# Patient Record
Sex: Female | Born: 1965 | Race: White | Hispanic: No | Marital: Married | Smoking: Never smoker
Health system: Southern US, Community
[De-identification: ages and names within clinical notes are randomized; demographics above are authoritative.]

## PROBLEM LIST (undated history)

## (undated) DIAGNOSIS — M069 Rheumatoid arthritis, unspecified: Secondary | ICD-10-CM

## (undated) DIAGNOSIS — E559 Vitamin D deficiency, unspecified: Secondary | ICD-10-CM

## (undated) DIAGNOSIS — R7989 Other specified abnormal findings of blood chemistry: Secondary | ICD-10-CM

## (undated) DIAGNOSIS — M5030 Other cervical disc degeneration, unspecified cervical region: Secondary | ICD-10-CM

## (undated) DIAGNOSIS — N809 Endometriosis, unspecified: Secondary | ICD-10-CM

## (undated) DIAGNOSIS — M47812 Spondylosis without myelopathy or radiculopathy, cervical region: Secondary | ICD-10-CM

## (undated) DIAGNOSIS — M899 Disorder of bone, unspecified: Secondary | ICD-10-CM

## (undated) HISTORY — PX: NASAL SINUS SURGERY: SHX719

## (undated) HISTORY — DX: Vitamin D deficiency, unspecified: E55.9

## (undated) HISTORY — PX: PARTIAL HYSTERECTOMY: SHX80

## (undated) HISTORY — DX: Spondylosis without myelopathy or radiculopathy, cervical region: M47.812

## (undated) HISTORY — DX: Disorder of bone, unspecified: M89.9

## (undated) HISTORY — PX: HERNIA REPAIR: SHX51

## (undated) HISTORY — DX: Endometriosis, unspecified: N80.9

## (undated) HISTORY — PX: ABDOMINAL HYSTERECTOMY: SHX81

## (undated) HISTORY — DX: Other specified abnormal findings of blood chemistry: R79.89

## (undated) HISTORY — DX: Other cervical disc degeneration, unspecified cervical region: M50.30

## (undated) HISTORY — DX: Rheumatoid arthritis, unspecified: M06.9

## (undated) HISTORY — PX: OOPHORECTOMY: SHX86

---

## 1997-04-24 ENCOUNTER — Inpatient Hospital Stay (HOSPITAL_COMMUNITY): Admission: AD | Admit: 1997-04-24 | Discharge: 1997-04-27 | Payer: Self-pay | Admitting: Obstetrics and Gynecology

## 1997-05-16 ENCOUNTER — Other Ambulatory Visit: Admission: RE | Admit: 1997-05-16 | Discharge: 1997-05-16 | Payer: Self-pay | Admitting: Obstetrics and Gynecology

## 2004-12-09 ENCOUNTER — Ambulatory Visit: Payer: Self-pay | Admitting: Surgery

## 2005-02-15 HISTORY — PX: CHOLECYSTECTOMY: SHX55

## 2007-03-16 ENCOUNTER — Ambulatory Visit: Payer: Self-pay | Admitting: Obstetrics and Gynecology

## 2007-05-18 ENCOUNTER — Ambulatory Visit: Payer: Self-pay | Admitting: Internal Medicine

## 2007-08-09 ENCOUNTER — Ambulatory Visit: Payer: Self-pay | Admitting: Gastroenterology

## 2008-11-14 ENCOUNTER — Ambulatory Visit: Payer: Self-pay | Admitting: General Practice

## 2008-11-16 ENCOUNTER — Emergency Department: Payer: Self-pay | Admitting: Emergency Medicine

## 2009-06-23 ENCOUNTER — Ambulatory Visit: Payer: Self-pay | Admitting: General Practice

## 2009-07-01 ENCOUNTER — Ambulatory Visit: Payer: Self-pay | Admitting: Unknown Physician Specialty

## 2010-09-07 ENCOUNTER — Ambulatory Visit: Payer: Self-pay | Admitting: Internal Medicine

## 2010-12-20 ENCOUNTER — Ambulatory Visit: Payer: Self-pay | Admitting: Internal Medicine

## 2011-04-16 ENCOUNTER — Ambulatory Visit: Payer: Self-pay | Admitting: Unknown Physician Specialty

## 2011-04-16 LAB — HM COLONOSCOPY

## 2011-06-18 ENCOUNTER — Ambulatory Visit: Payer: Self-pay | Admitting: General Practice

## 2011-09-22 ENCOUNTER — Ambulatory Visit: Payer: Self-pay | Admitting: Internal Medicine

## 2011-09-24 ENCOUNTER — Ambulatory Visit: Payer: Self-pay | Admitting: Internal Medicine

## 2012-01-12 ENCOUNTER — Ambulatory Visit: Payer: Self-pay | Admitting: Obstetrics and Gynecology

## 2012-01-12 LAB — BASIC METABOLIC PANEL
Anion Gap: 8 (ref 7–16)
Calcium, Total: 8.8 mg/dL (ref 8.5–10.1)
Chloride: 105 mmol/L (ref 98–107)
Co2: 27 mmol/L (ref 21–32)
EGFR (African American): >60
Glucose: 95 mg/dL (ref 65–99)
Osmolality: 278 (ref 275–301)
Sodium: 140 mmol/L (ref 136–145)

## 2012-01-12 LAB — CBC
HCT: 38.8 % (ref 35.0–47.0)
Platelet: 272 10*3/uL (ref 150–440)
WBC: 9.5 10*3/uL (ref 3.6–11.0)

## 2012-01-17 ENCOUNTER — Ambulatory Visit: Payer: Self-pay | Admitting: Obstetrics and Gynecology

## 2012-01-18 LAB — CREATININE, SERUM
EGFR (African American): >60
EGFR (Non-African Amer.): >60

## 2012-01-18 LAB — HEMOGLOBIN: HGB: 11.4 g/dL — ABNORMAL LOW (ref 12.0–16.0)

## 2012-01-19 LAB — PATHOLOGY REPORT

## 2014-06-04 NOTE — Op Note (Signed)
PATIENT NAME:  Kimberly Cantrell, Kimberly Cantrell MR#:  [id number] DATE OF BIRTH:  1965-08-04  DATE OF PROCEDURE:  01/17/2012  PREOPERATIVE DIAGNOSES:  1. Chronic pelvic pain.  2. Menorrhagia.  3. Leiomyoma uteri.   POSTOPERATIVE DIAGNOSES:  1. Chronic pelvic pain.  2. Menorrhagia.  3. Leiomyoma uteri.  4. Right ovarian cyst.  5. Endometriosis.   OPERATIVE PROCEDURE:  1. LAVH, LSO.  2. Right salpingectomy.  3. Right ovarian cyst aspiration.  4. Excisional biopsies of endometriosis.   SURGEON: Alanda Slim. Haly Feher, MD   FIRST ASSISTANT: None.   ANESTHESIA: General endotracheal.   INDICATIONS: The patient is a 49 year old married white female, para 3-0-0-3, who presents for definitive surgical management of chronic pelvic pain and menorrhagia. Preoperative ultrasound demonstrated a small uterine fibroid in the fundus as well as a 6 cm simple ovarian cyst.   FINDINGS AT SURGERY: Findings at surgery revealed a uterus that was top normal size with a small fundal fibroid. There was a simple right ovarian cyst present. This cyst was aspirated and noted to have serosanguineous fluid. There was evidence of endometriosis implants on the bladder flap, right anterior abdominal wall near the round ligament insertion site, and on the left infundibulopelvic ligament and left pelvic sidewall in the region of the ovarian fossa. The left ovary appeared normal.   DESCRIPTION OF PROCEDURE: The patient was brought to the operating room where she was placed in the supine position. General endotracheal anesthesia was induced without difficulty. She was placed in the dorsal lithotomy position using the bumblebee stirrups. A ChloraPrep and Betadine abdominal, perineal, and intravaginal prep and drape was performed in the standard fashion. Foley catheter was placed and was draining clear yellow urine. A Hulka tenaculum was placed onto the cervix to facilitate uterine manipulation. Subumbilical vertical incision 5 mm in length  was made. The Optiview laparoscopic trocar system was used to place a 5 mm camera port in the subumbilical incision through direct visualization. No bowel or vascular injury was encountered. Pneumoperitoneum was created with CO2 gas. Two other 5 mm ports were placed in the right and left lower quadrants respectively under direct visualization. The procedure was then performed in the following manner.   The right tube was grasped and the Ace Harmonic scalpel instrument was used to facilitate clamping, coagulating, and incising the mesosalpinx. The fallopian tube was removed through this procedure down to the level of the uterine cornua. Good hemostasis was obtained. The right ovarian cyst was then incised with the Harmonic scalpel and it thus released approximately 25 mL of serosanguineous yellow fluid.   Next, the hysterectomy was performed in standard fashion. The right round ligament was clamped, coagulated, and cut using the Harmonic scalpel. The cardinal broad ligament complexes were then taken down in the standard technique using the Harmonic scalpel to expose the uterine vessels. The anterior peritoneum was incised and the bladder flap was created through sharp and blunt dissection. Once the ligamentous attachments on the right were taken down, the procedure was carried out on the left side although this also incorporated the infundibulopelvic ligament with removal of the left tube and ovary as well. Care was taken to avoid the ureters. This process again was carried down through the cardinal broad ligament complexes to the level of the uterosacral ligaments. Completion of the bladder flap was performed with the Harmonic scalpel. The uterine vessels were then cauterized with Kleppinger bipolar forceps. This completed the laparoscopic portion of the procedure.   The patient was then placed in  the dorsal lithotomy position and the vaginal aspect of the procedure was completed. A weighted speculum was  placed into the vagina. A double-tooth tenaculum was placed onto the cervix. The previously placed Hulka tenaculum was removed. The posterior colpotomy was made with Mayo scissors. Uterosacral ligaments were clamped, cut, and stick tied using 0 Vicryl suture. Cervix was circumscribed with the Bovie cautery. The bladder was dissected off the lower uterine segment through sharp and blunt dissection. Eventually the anterior cul-de-sac was entered. Once the final pedicles of the cardinal ligament complex were then clamped, cut, and stick tied, the specimen was removed from the operative field. The posterior cuff of the vagina was run using 0 Vicryl suture in a simple baseball stitch-type technique. This was followed by closure of the vagina with 2-0 chromic sutures in a simple interrupted manner. Good vaginal vault support was noted.   Finally, repeat laparoscopy was performed with pneumoperitoneum being recreated in the abdomen. Evaluation with the laparoscope revealed all pedicles to be hemostatic. The peritoneal excisional biopsies of the endometriosis were then accomplished and these were sent to pathology. Procedure was then terminated with all instrumentation being removed from the abdominopelvic cavity. Pneumoperitoneum was released. The incisions were closed with Dermabond glue. The patient was then awakened, extubated, and taken to the recovery room in satisfactory condition. Estimated blood loss was 250 mL. IV fluids were 1600 mL. Urine output was not quantified at the time of this dictation.   All instruments, needles, and sponge counts were verified as correct. The patient did receive Ancef antibiotic prophylaxis.   ____________________________ Alanda Slim Cyndia Degraff, MD mad:drc D: 01/18/2012 23:25:41 ET T: 01/19/2012 08:56:41 ET JOB#: [id number]  cc: Hassell Done A. Danella Philson, MD, <Dictator> [facility] MD ELECTRONICALLY SIGNED 01/26/2012 11:57

## 2014-11-15 ENCOUNTER — Other Ambulatory Visit: Payer: Self-pay | Admitting: Physician Assistant

## 2014-11-15 NOTE — Telephone Encounter (Signed)
Rx written on 05/11/14  Last filled on 09/19/14

## 2014-11-15 NOTE — Telephone Encounter (Signed)
Patient was contacted and informed that she needs to be seen; Denied refill pt acknowledge understanding.

## 2014-12-05 ENCOUNTER — Telehealth: Payer: Self-pay | Admitting: Physician Assistant

## 2014-12-05 NOTE — Telephone Encounter (Signed)
Tell Kimberly Cantrell she needs to be seen in the clinic for refills of a controlled substance.

## 2014-12-09 ENCOUNTER — Encounter: Payer: Self-pay | Admitting: Physician Assistant

## 2014-12-09 ENCOUNTER — Ambulatory Visit: Payer: Self-pay | Admitting: Physician Assistant

## 2014-12-09 ENCOUNTER — Other Ambulatory Visit: Payer: Self-pay | Admitting: Physician Assistant

## 2014-12-09 VITALS — BP 100/72 | Temp 98.3°F | Wt 150.0 lb

## 2014-12-09 DIAGNOSIS — G4700 Insomnia, unspecified: Secondary | ICD-10-CM

## 2014-12-09 MED ORDER — ZOLPIDEM TARTRATE 10 MG PO TABS: 10.0000 mg | ORAL_TABLET | Freq: Every evening | ORAL | Status: DC | PRN

## 2014-12-09 NOTE — Progress Notes (Signed)
S: c/o insomnia, has been using [place] about 3 nights a week, out of medication, also has dentist appointment on Wed for tooth pain, ?if she should be on antibiotic or can it wait until tues, ?if tooth is fx or has cavity, dental floss is catching on tooth  O: vitals wnl, nad, upper back left molar with ?fx on tooth, gums are wnl, neck supple no lymph, lungs c t a, cv rrr  A: insomnia, tooth pain  P: ambien 10mg  1 po qhs, #30 3 refills, return if additional refills needed, f/u with dentist, no antibiotic needed

## 2014-12-10 NOTE — Telephone Encounter (Signed)
Completed patient was seen in office for New Rx(Ambien) on 12/09/2014

## 2015-01-17 ENCOUNTER — Other Ambulatory Visit: Payer: Self-pay | Admitting: Orthopedic Surgery

## 2015-01-17 ENCOUNTER — Ambulatory Visit: Payer: Self-pay | Admitting: Physician Assistant

## 2015-01-17 DIAGNOSIS — M5412 Radiculopathy, cervical region: Secondary | ICD-10-CM

## 2015-01-24 ENCOUNTER — Other Ambulatory Visit: Payer: Self-pay | Admitting: Orthopedic Surgery

## 2015-01-24 DIAGNOSIS — M545 Low back pain: Secondary | ICD-10-CM

## 2015-02-04 ENCOUNTER — Telehealth: Payer: Self-pay | Admitting: Obstetrics and Gynecology

## 2015-02-04 ENCOUNTER — Other Ambulatory Visit: Payer: Self-pay

## 2015-02-04 DIAGNOSIS — M2550 Pain in unspecified joint: Secondary | ICD-10-CM

## 2015-02-04 DIAGNOSIS — Z01419 Encounter for gynecological examination (general) (routine) without abnormal findings: Secondary | ICD-10-CM

## 2015-02-04 DIAGNOSIS — Z1239 Encounter for other screening for malignant neoplasm of breast: Secondary | ICD-10-CM

## 2015-02-04 NOTE — Telephone Encounter (Signed)
Patient called requesting a referral to [facility]. She is having joint pain. Thanks

## 2015-02-04 NOTE — Telephone Encounter (Signed)
Referral and mammo ordered. Pt aware per vm.

## 2015-02-04 NOTE — Telephone Encounter (Signed)
Patient also needs an order put in epic for a screening mammo. Thanks

## 2015-02-04 NOTE — Telephone Encounter (Signed)
Done see previous message

## 2015-02-05 LAB — LIPID PANEL
CHOLESTEROL TOTAL: 175 mg/dL (ref 100–199)
Chol/HDL Ratio: 4.1 ratio units (ref 0.0–4.4)
HDL: 43 mg/dL (ref >39)
LDL Calculated: 86 mg/dL (ref 0–99)
TRIGLYCERIDES: 230 mg/dL — AB (ref 0–149)
VLDL Cholesterol Cal: 46 mg/dL — ABNORMAL HIGH (ref 5–40)

## 2015-02-05 LAB — HEMOGLOBIN A1C
Est. average glucose Bld gHb Est-mCnc: 91 mg/dL
HEMOGLOBIN A1C: 4.8 % (ref 4.8–5.6)

## 2015-02-05 LAB — TSH: TSH: 4.15 u[IU]/mL (ref 0.450–4.500)

## 2015-02-05 LAB — VITAMIN D 25 HYDROXY (VIT D DEFICIENCY, FRACTURES): Vit D, 25-Hydroxy: 13.6 ng/mL — ABNORMAL LOW (ref 30.0–100.0)

## 2015-02-05 LAB — GLUCOSE, FASTING: Glucose, Plasma: 89 mg/dL (ref 65–99)

## 2015-02-06 ENCOUNTER — Ambulatory Visit
Admission: RE | Admit: 2015-02-06 | Discharge: 2015-02-06 | Disposition: A | Discharge status: Home / Self Care | Payer: 59 | Source: Ambulatory Visit | Attending: Orthopedic Surgery | Admitting: Orthopedic Surgery

## 2015-02-06 DIAGNOSIS — M50321 Other cervical disc degeneration at C4-C5 level: Secondary | ICD-10-CM | POA: Insufficient documentation

## 2015-02-06 DIAGNOSIS — M545 Low back pain: Secondary | ICD-10-CM | POA: Insufficient documentation

## 2015-02-06 DIAGNOSIS — M5412 Radiculopathy, cervical region: Secondary | ICD-10-CM | POA: Diagnosis present

## 2015-02-06 DIAGNOSIS — M4802 Spinal stenosis, cervical region: Secondary | ICD-10-CM | POA: Insufficient documentation

## 2015-02-07 ENCOUNTER — Encounter: Payer: Self-pay | Admitting: Family Medicine

## 2015-02-07 ENCOUNTER — Other Ambulatory Visit: Payer: Self-pay | Admitting: Family Medicine

## 2015-02-07 ENCOUNTER — Inpatient Hospital Stay: Payer: 59 | Attending: Family Medicine | Admitting: *Deleted

## 2015-02-07 DIAGNOSIS — M899 Disorder of bone, unspecified: Secondary | ICD-10-CM | POA: Insufficient documentation

## 2015-02-07 DIAGNOSIS — M4802 Spinal stenosis, cervical region: Secondary | ICD-10-CM | POA: Insufficient documentation

## 2015-02-07 DIAGNOSIS — E559 Vitamin D deficiency, unspecified: Secondary | ICD-10-CM | POA: Diagnosis not present

## 2015-02-07 DIAGNOSIS — M5031 Other cervical disc degeneration,  high cervical region: Secondary | ICD-10-CM | POA: Insufficient documentation

## 2015-02-07 DIAGNOSIS — R200 Anesthesia of skin: Secondary | ICD-10-CM | POA: Diagnosis not present

## 2015-02-07 DIAGNOSIS — M549 Dorsalgia, unspecified: Secondary | ICD-10-CM | POA: Insufficient documentation

## 2015-02-07 DIAGNOSIS — Z9181 History of falling: Secondary | ICD-10-CM | POA: Insufficient documentation

## 2015-02-07 DIAGNOSIS — R937 Abnormal findings on diagnostic imaging of other parts of musculoskeletal system: Secondary | ICD-10-CM | POA: Diagnosis not present

## 2015-02-07 HISTORY — DX: Disorder of bone, unspecified: M89.9

## 2015-02-07 LAB — CBC WITH DIFFERENTIAL/PLATELET
BASOS PCT: 0 %
Basophils Absolute: 0 10*3/uL (ref 0–0.1)
EOS ABS: 0.1 10*3/uL (ref 0–0.7)
EOS PCT: 0 %
HCT: 39.2 % (ref 35.0–47.0)
HEMOGLOBIN: 13.3 g/dL (ref 12.0–16.0)
Lymphocytes Relative: 15 %
Lymphs Abs: 1.9 10*3/uL (ref 1.0–3.6)
MCH: 30.8 pg (ref 26.0–34.0)
MCHC: 34 g/dL (ref 32.0–36.0)
MCV: 90.6 fL (ref 80.0–100.0)
MONOS PCT: 3 %
Monocytes Absolute: 0.4 10*3/uL (ref 0.2–0.9)
NEUTROS PCT: 82 %
Neutro Abs: 9.9 10*3/uL — ABNORMAL HIGH (ref 1.4–6.5)
PLATELETS: 269 10*3/uL (ref 150–440)
RBC: 4.33 MIL/uL (ref 3.80–5.20)
RDW: 12.7 % (ref 11.5–14.5)
WBC: 12.2 10*3/uL — AB (ref 3.6–11.0)

## 2015-02-07 LAB — COMPREHENSIVE METABOLIC PANEL
ALBUMIN: 4.2 g/dL (ref 3.5–5.0)
ALK PHOS: 100 U/L (ref 38–126)
ALT: 18 U/L (ref 14–54)
ANION GAP: 9 (ref 5–15)
AST: 19 U/L (ref 15–41)
BUN: 8 mg/dL (ref 6–20)
CALCIUM: 9.2 mg/dL (ref 8.9–10.3)
CHLORIDE: 105 mmol/L (ref 101–111)
CO2: 26 mmol/L (ref 22–32)
Creatinine, Ser: 0.81 mg/dL (ref 0.44–1.00)
GFR calc Af Amer: >60 mL/min (ref >60)
GFR calc non Af Amer: >60 mL/min (ref >60)
GLUCOSE: 96 mg/dL (ref 65–99)
POTASSIUM: 3.7 mmol/L (ref 3.5–5.1)
SODIUM: 140 mmol/L (ref 135–145)
Total Bilirubin: 0.5 mg/dL (ref 0.3–1.2)
Total Protein: 7 g/dL (ref 6.5–8.1)

## 2015-02-11 ENCOUNTER — Inpatient Hospital Stay (HOSPITAL_BASED_OUTPATIENT_CLINIC_OR_DEPARTMENT_OTHER): Payer: 59 | Admitting: Family Medicine

## 2015-02-11 ENCOUNTER — Encounter: Payer: Self-pay | Admitting: Family Medicine

## 2015-02-11 ENCOUNTER — Inpatient Hospital Stay: Payer: 59

## 2015-02-11 ENCOUNTER — Ambulatory Visit
Admission: RE | Admit: 2015-02-11 | Discharge: 2015-02-11 | Disposition: A | Discharge status: Home / Self Care | Payer: 59 | Source: Ambulatory Visit | Attending: Obstetrics and Gynecology | Admitting: Obstetrics and Gynecology

## 2015-02-11 ENCOUNTER — Ambulatory Visit: Payer: Self-pay | Admitting: Family Medicine

## 2015-02-11 ENCOUNTER — Other Ambulatory Visit: Payer: Self-pay

## 2015-02-11 ENCOUNTER — Other Ambulatory Visit: Payer: Self-pay | Admitting: Family Medicine

## 2015-02-11 VITALS — BP 109/51 | HR 80 | Temp 98.8°F | Resp 18 | Ht 68.0 in | Wt 149.5 lb

## 2015-02-11 DIAGNOSIS — M5031 Other cervical disc degeneration,  high cervical region: Secondary | ICD-10-CM

## 2015-02-11 DIAGNOSIS — Z1231 Encounter for screening mammogram for malignant neoplasm of breast: Secondary | ICD-10-CM | POA: Insufficient documentation

## 2015-02-11 DIAGNOSIS — M899 Disorder of bone, unspecified: Secondary | ICD-10-CM

## 2015-02-11 DIAGNOSIS — M4802 Spinal stenosis, cervical region: Secondary | ICD-10-CM

## 2015-02-11 DIAGNOSIS — E559 Vitamin D deficiency, unspecified: Secondary | ICD-10-CM

## 2015-02-11 DIAGNOSIS — M549 Dorsalgia, unspecified: Secondary | ICD-10-CM

## 2015-02-11 DIAGNOSIS — R200 Anesthesia of skin: Secondary | ICD-10-CM

## 2015-02-11 DIAGNOSIS — Z9181 History of falling: Secondary | ICD-10-CM | POA: Diagnosis not present

## 2015-02-11 DIAGNOSIS — R937 Abnormal findings on diagnostic imaging of other parts of musculoskeletal system: Secondary | ICD-10-CM

## 2015-02-11 DIAGNOSIS — Z1239 Encounter for other screening for malignant neoplasm of breast: Secondary | ICD-10-CM

## 2015-02-11 LAB — MISC LABCORP TEST (SEND OUT): LABCORP TEST CODE: 3715

## 2015-02-11 LAB — PROTEIN ELECTROPHORESIS, SERUM
A/G Ratio: 1.2 (ref 0.7–1.7)
ALBUMIN ELP: 3.5 g/dL (ref 2.9–4.4)
Alpha-1-Globulin: 0.3 g/dL (ref 0.0–0.4)
Alpha-2-Globulin: 0.7 g/dL (ref 0.4–1.0)
Beta Globulin: 1 g/dL (ref 0.7–1.3)
GLOBULIN, TOTAL: 2.9 g/dL (ref 2.2–3.9)
Gamma Globulin: 0.9 g/dL (ref 0.4–1.8)
Total Protein ELP: 6.4 g/dL (ref 6.0–8.5)

## 2015-02-11 LAB — SEDIMENTATION RATE: Sed Rate: 20 mm/h (ref 0–20)

## 2015-02-11 NOTE — Progress Notes (Signed)
[place]  [phone]  [phone]) [phone]     Kimberly Cantrell DOB: 08-11-1965  MR#: [id number]  HLK#:[id number]  Patient Care Team: Brayton Mars, MD as PCP - General (Obstetrics and Gynecology)  CHIEF COMPLAINT:  Chief Complaint  Patient presents with  . Results    abn mri spine    INTERVAL HISTORY:  49 year old with significant back and neck pain that has recently had an abnormal MRI of the thoracic spine. Noted with history of cervical neck pain radiating to both shoulders and arms with bilateral upper extremity numbness. Symptoms worse at night, She is also status post fall 4 years ago. She also has a known history of benign hemangiomas within the T11, T12, L1, and L2. Patient will be established with Dr. Oliva Bustard and is seen in his absence. Dr. Oliva Bustard has reviewed medical records and lab values and we have discussed her plan of care via telephone.   Patient was evaluated by Dr. Marlou Sa and cervical and lumbar spine were ordered on 02/06/2015. Cervical MRI noted to have progressed multifactorial C6 and C7 neural foraminal stenosis since 2013, moderate to severe; Severe left C8 foraminal stenosis has not significantly changed; Stable borderline to mild spinal stenosis at C5-C6; mild regression of disc degeneration at C4-C5.  Lumbar MRI showed degenerative disc disease at L3-4. Retrolisthesis of 2 mm. Bulging of the disc, focally more prominent in the left posterior lateral direction. Mild narrowing of the left lateral recess but no visible neural compression. The findings at this level could be associated with back pain. No other significant degenerative finding. MRI significant for development of a 16 mm T2 bright lesion within the T12 vertebral body.   REVIEW OF SYSTEMS:   Review of Systems  Constitutional: Negative for fever, chills, weight loss, malaise/fatigue and diaphoresis.  HENT: Negative for congestion and sore throat.   Eyes: Negative.     Respiratory: Negative for cough, hemoptysis, sputum production, shortness of breath, wheezing and stridor.   Cardiovascular: Negative for chest pain, palpitations, orthopnea, claudication, leg swelling and PND.  Gastrointestinal: Negative for heartburn, nausea, vomiting, abdominal pain, diarrhea, constipation, blood in stool and melena.  Genitourinary: Negative.   Musculoskeletal: Positive for back pain and neck pain.  Skin: Negative.   Neurological: Negative for dizziness, tingling, focal weakness, seizures and weakness.  Endo/Heme/Allergies: Does not bruise/bleed easily.  Psychiatric/Behavioral: Negative for depression. The patient is not nervous/anxious and does not have insomnia.     As per HPI. Otherwise, a complete review of systems is negatve.   PAST MEDICAL HISTORY: Past Medical History  Diagnosis Date  . Bone lesion 02/07/2015  . Endometriosis   . Vitamin D deficiency   . DJD (degenerative joint disease) of cervical spine   . DDD (degenerative disc disease), cervical     PAST SURGICAL HISTORY: Past Surgical History  Procedure Laterality Date  . Partial hysterectomy      1 ovary remaining  . Hernia repair      age 86  . Nasal sinus surgery      age 52  . Cholecystectomy  2007    FAMILY HISTORY Family History  Problem Relation Age of Onset  . Heart attack Father     GYNECOLOGIC HISTORY:  No LMP recorded. Patient has had a hysterectomy.     ADVANCED DIRECTIVES:    HEALTH MAINTENANCE: Social History  Substance Use Topics  . Smoking status: Never Smoker   . Smokeless tobacco: Never Used  . Alcohol Use:  No     Allergies  Allergen Reactions  . Metronidazole Other (See Comments)  . Clindamycin Hcl Rash  . Ketoconazole Rash    Current Outpatient Prescriptions  Medication Sig Dispense Refill  . Naproxen Sodium (ALEVE) 220 MG CAPS Take 1-2 capsule by mouth three times a day as needed for pain.    Marland Kitchen zolpidem (AMBIEN) 10 MG tablet Take 1 tablet (10 mg  total) by mouth at bedtime as needed for sleep. 30 tablet 3   No current facility-administered medications for this visit.    OBJECTIVE: BP 109/51 mmHg  Pulse 80  Temp(Src) 98.8 F (37.1 C)  Resp 18  Ht '5\' 8"'$  (1.727 m)  Wt 149 lb 7.6 oz (67.8 kg)  BMI 22.73 kg/m2   Body mass index is 22.73 kg/(m^2).    ECOG FS:1 - Symptomatic but completely ambulatory  General: Well-developed, well-nourished, no acute distress. Eyes: Pink conjunctiva, anicteric sclera. HEENT: Normocephalic, moist mucous membranes, clear oropharnyx. Lungs: Clear to auscultation bilaterally. Heart: Regular rate and rhythm. No rubs, murmurs, or gallops. Abdomen: Soft, nontender, nondistended. No organomegaly noted, normoactive bowel sounds. Breast: Breast palpated in a circular manner in the sitting and supine positions.  No masses or fullness palpated.  Axilla palpated in both positions with no masses or fullness palpated.  Musculoskeletal: No edema, cyanosis, or clubbing. Neuro: Alert, answering all questions appropriately. Cranial nerves grossly intact. Skin: No rashes or petechiae noted. Psych: Normal affect. Lymphatics: No cervical, clavicular, or axillary LAD.   LAB RESULTS:  No visits with results within 3 Day(s) from this visit. Latest known visit with results is:  Clinical Support on 02/07/2015  Component Date Value Ref Range Status  . WBC 02/07/2015 12.2* 3.6 - 11.0 K/uL Final  . RBC 02/07/2015 4.33  3.80 - 5.20 MIL/uL Final  . Hemoglobin 02/07/2015 13.3  12.0 - 16.0 g/dL Final  . HCT 02/07/2015 39.2  35.0 - 47.0 % Final  . MCV 02/07/2015 90.6  80.0 - 100.0 fL Final  . MCH 02/07/2015 30.8  26.0 - 34.0 pg Final  . MCHC 02/07/2015 34.0  32.0 - 36.0 g/dL Final  . RDW 02/07/2015 12.7  11.5 - 14.5 % Final  . Platelets 02/07/2015 269  150 - 440 K/uL Final  . Neutrophils Relative % 02/07/2015 82   Final  . Neutro Abs 02/07/2015 9.9* 1.4 - 6.5 K/uL Final  . Lymphocytes Relative 02/07/2015 15   Final  .  Lymphs Abs 02/07/2015 1.9  1.0 - 3.6 K/uL Final  . Monocytes Relative 02/07/2015 3   Final  . Monocytes Absolute 02/07/2015 0.4  0.2 - 0.9 K/uL Final  . Eosinophils Relative 02/07/2015 0   Final  . Eosinophils Absolute 02/07/2015 0.1  0 - 0.7 K/uL Final  . Basophils Relative 02/07/2015 0   Final  . Basophils Absolute 02/07/2015 0.0  0 - 0.1 K/uL Final  . Sodium 02/07/2015 140  135 - 145 mmol/L Final  . Potassium 02/07/2015 3.7  3.5 - 5.1 mmol/L Final  . Chloride 02/07/2015 105  101 - 111 mmol/L Final  . CO2 02/07/2015 26  22 - 32 mmol/L Final  . Glucose, Bld 02/07/2015 96  65 - 99 mg/dL Final  . BUN 02/07/2015 8  6 - 20 mg/dL Final  . Creatinine, Ser 02/07/2015 0.81  0.44 - 1.00 mg/dL Final  . Calcium 02/07/2015 9.2  8.9 - 10.3 mg/dL Final  . Total Protein 02/07/2015 7.0  6.5 - 8.1 g/dL Final  . Albumin 02/07/2015 4.2  3.5 - 5.0 g/dL Final  . AST 02/07/2015 19  15 - 41 U/L Final  . ALT 02/07/2015 18  14 - 54 U/L Final  . Alkaline Phosphatase 02/07/2015 100  38 - 126 U/L Final  . Total Bilirubin 02/07/2015 0.5  0.3 - 1.2 mg/dL Final  . GFR calc non Af Amer 02/07/2015 >60  >60 mL/min Final  . GFR calc Af Amer 02/07/2015 >60  >60 mL/min Final   Comment: (NOTE) The eGFR has been calculated using the CKD EPI equation. This calculation has not been validated in all clinical situations. eGFR's persistently <60 mL/min signify possible Chronic Kidney Disease.   . Anion gap 02/07/2015 9  5 - 15 Final    STUDIES: No results found.  ASSESSMENT:  Bone lesion involving the T12 vertebrae.   PLAN:   1. Suspicious bone lesion of T12 vertebrae. MRI has been reviewed by myself as well as Dr. Oliva Bustard. Lesion in question is involving the T12 vertebrae. She has benign hemangiomas involving the T11, T12, L1, and L2 vertebrae. The lesion in question is new from 2013 on the T12 vertebral body that measures 107m. Differentials include metastatic disease, myeloma, or more benign findings including  development of hemangioma, worsening disc disease.   Instructed patient that she needed to have yearly screening mammogram performed asap. Also collected labs to further evaluate if there is any significant finding for Multiple Myeloma.  Will schedule CT of chest, abdomen, and pelvis with contrast for further evaluation of other major organs. Skeletal Survey to be performed for further evaluation as well.  Will also add patient for Radiologic review during case conference the week of February 20, 2015.  Will continue with follow up once labs have been received as well as radiology images.   Patient expressed understanding and was in agreement with this plan. She also understands that She can call clinic at any time with any questions, concerns, or complaints.   Dr. COliva Bustardwas consulted and review of plan of care performed for this patient.   LEvlyn Kanner NP   02/11/2015 10:41 AM

## 2015-02-12 ENCOUNTER — Ambulatory Visit
Admission: RE | Admit: 2015-02-12 | Discharge: 2015-02-12 | Disposition: A | Discharge status: Home / Self Care | Payer: 59 | Source: Ambulatory Visit | Attending: Family Medicine | Admitting: Family Medicine

## 2015-02-12 ENCOUNTER — Other Ambulatory Visit: Payer: Self-pay | Admitting: Family Medicine

## 2015-02-12 DIAGNOSIS — M899 Disorder of bone, unspecified: Secondary | ICD-10-CM | POA: Diagnosis not present

## 2015-02-12 DIAGNOSIS — N83201 Unspecified ovarian cyst, right side: Secondary | ICD-10-CM | POA: Insufficient documentation

## 2015-02-12 MED ORDER — IOHEXOL 300 MG/ML  SOLN
100.0000 mL | Freq: Once | INTRAMUSCULAR | Status: AC | PRN
  Administered 2015-02-12: 100 mL via INTRAVENOUS

## 2015-02-13 LAB — KAPPA/LAMBDA LIGHT CHAINS
Kappa free light chain: 10.39 mg/L (ref 3.30–19.40)
Kappa, lambda light chain ratio: 0.91 (ref 0.26–1.65)
Lambda free light chains: 11.48 mg/L (ref 5.71–26.30)

## 2015-02-13 LAB — RHEUMATOID ARTHRITIS PROFILE
CCP Antibodies IgG/IgA: 71 units — ABNORMAL HIGH (ref 0–19)
Rhuematoid fact SerPl-aCnc: <10 IU/mL (ref 0.0–13.9)

## 2015-02-18 ENCOUNTER — Telehealth: Payer: Self-pay

## 2015-02-18 DIAGNOSIS — E782 Mixed hyperlipidemia: Secondary | ICD-10-CM

## 2015-02-18 NOTE — Telephone Encounter (Signed)
Pt aware. Lipid panel ordered for 6 months. Put on lab schedule.

## 2015-02-18 NOTE — Telephone Encounter (Signed)
-----   Message from Brayton Mars, MD sent at 02/07/2015 11:20 AM EST ----- Please notify - Abnormal Labs Take vitamin D 1000 units daily. Recommended dietary modification with low-fat diet and recheck lipid 1 panel in 6 months

## 2015-02-27 ENCOUNTER — Other Ambulatory Visit: Payer: Self-pay | Admitting: Family Medicine

## 2015-03-10 DIAGNOSIS — L570 Actinic keratosis: Secondary | ICD-10-CM | POA: Diagnosis not present

## 2015-03-10 DIAGNOSIS — D485 Neoplasm of uncertain behavior of skin: Secondary | ICD-10-CM | POA: Diagnosis not present

## 2015-03-10 DIAGNOSIS — D225 Melanocytic nevi of trunk: Secondary | ICD-10-CM | POA: Diagnosis not present

## 2015-03-27 DIAGNOSIS — R1012 Left upper quadrant pain: Secondary | ICD-10-CM | POA: Diagnosis not present

## 2015-03-27 DIAGNOSIS — R1013 Epigastric pain: Secondary | ICD-10-CM | POA: Diagnosis not present

## 2015-03-28 ENCOUNTER — Ambulatory Visit: Payer: Self-pay | Admitting: Physician Assistant

## 2015-04-01 DIAGNOSIS — R1012 Left upper quadrant pain: Secondary | ICD-10-CM | POA: Diagnosis not present

## 2015-04-01 DIAGNOSIS — M542 Cervicalgia: Secondary | ICD-10-CM | POA: Diagnosis not present

## 2015-04-01 DIAGNOSIS — G8929 Other chronic pain: Secondary | ICD-10-CM | POA: Diagnosis not present

## 2015-04-09 DIAGNOSIS — M5412 Radiculopathy, cervical region: Secondary | ICD-10-CM | POA: Diagnosis not present

## 2015-04-09 DIAGNOSIS — M5413 Radiculopathy, cervicothoracic region: Secondary | ICD-10-CM | POA: Diagnosis not present

## 2015-04-29 DIAGNOSIS — R1012 Left upper quadrant pain: Secondary | ICD-10-CM | POA: Diagnosis not present

## 2015-05-08 ENCOUNTER — Other Ambulatory Visit: Payer: Self-pay | Admitting: *Deleted

## 2015-05-08 ENCOUNTER — Inpatient Hospital Stay: Payer: 59 | Attending: Family Medicine

## 2015-05-08 DIAGNOSIS — K769 Liver disease, unspecified: Secondary | ICD-10-CM

## 2015-05-08 DIAGNOSIS — R109 Unspecified abdominal pain: Secondary | ICD-10-CM

## 2015-05-08 DIAGNOSIS — M899 Disorder of bone, unspecified: Secondary | ICD-10-CM | POA: Insufficient documentation

## 2015-05-08 DIAGNOSIS — M549 Dorsalgia, unspecified: Secondary | ICD-10-CM

## 2015-05-08 LAB — CBC WITH DIFFERENTIAL/PLATELET
BASOS ABS: 0.1 10*3/uL (ref 0–0.1)
Basophils Relative: 1 %
EOS PCT: 1 %
Eosinophils Absolute: 0.1 10*3/uL (ref 0–0.7)
HCT: 41.2 % (ref 35.0–47.0)
Hemoglobin: 14.3 g/dL (ref 12.0–16.0)
LYMPHS PCT: 23 %
Lymphs Abs: 2.6 10*3/uL (ref 1.0–3.6)
MCH: 31.4 pg (ref 26.0–34.0)
MCHC: 34.7 g/dL (ref 32.0–36.0)
MCV: 90.4 fL (ref 80.0–100.0)
MONO ABS: 0.5 10*3/uL (ref 0.2–0.9)
Monocytes Relative: 5 %
NEUTROS PCT: 70 %
Neutro Abs: 8 10*3/uL — ABNORMAL HIGH (ref 1.4–6.5)
PLATELETS: 298 10*3/uL (ref 150–440)
RBC: 4.55 MIL/uL (ref 3.80–5.20)
RDW: 12.5 % (ref 11.5–14.5)
WBC: 11.3 10*3/uL — AB (ref 3.6–11.0)

## 2015-05-08 LAB — URINALYSIS COMPLETE WITH MICROSCOPIC (ARMC ONLY)
BILIRUBIN URINE: NEGATIVE
Bacteria, UA: NONE SEEN
Glucose, UA: NEGATIVE mg/dL
KETONES UR: NEGATIVE mg/dL
Leukocytes, UA: NEGATIVE
Nitrite: NEGATIVE
Protein, ur: NEGATIVE mg/dL
Specific Gravity, Urine: 1.011 (ref 1.005–1.030)
pH: 6 (ref 5.0–8.0)

## 2015-05-09 LAB — CEA: CEA: 1.1 ng/mL (ref 0.0–4.7)

## 2015-05-09 LAB — AFP TUMOR MARKER: AFP TUMOR MARKER: 6.1 ng/mL (ref 0.0–8.3)

## 2015-05-10 LAB — URINE CULTURE

## 2015-05-20 DIAGNOSIS — K3189 Other diseases of stomach and duodenum: Secondary | ICD-10-CM | POA: Diagnosis not present

## 2015-05-20 DIAGNOSIS — K2960 Other gastritis without bleeding: Secondary | ICD-10-CM | POA: Diagnosis not present

## 2015-05-20 DIAGNOSIS — K2950 Unspecified chronic gastritis without bleeding: Secondary | ICD-10-CM | POA: Diagnosis not present

## 2015-05-20 DIAGNOSIS — R1012 Left upper quadrant pain: Secondary | ICD-10-CM | POA: Diagnosis not present

## 2015-05-20 DIAGNOSIS — K2970 Gastritis, unspecified, without bleeding: Secondary | ICD-10-CM | POA: Diagnosis not present

## 2015-05-29 ENCOUNTER — Other Ambulatory Visit: Payer: Self-pay | Admitting: Physician Assistant

## 2015-05-29 DIAGNOSIS — R1084 Generalized abdominal pain: Secondary | ICD-10-CM | POA: Diagnosis not present

## 2015-05-29 DIAGNOSIS — R1012 Left upper quadrant pain: Secondary | ICD-10-CM

## 2015-05-30 ENCOUNTER — Ambulatory Visit
Admission: RE | Admit: 2015-05-30 | Discharge: 2015-05-30 | Disposition: A | Discharge status: Home / Self Care | Payer: 59 | Source: Ambulatory Visit | Attending: Oncology | Admitting: Oncology

## 2015-05-30 ENCOUNTER — Ambulatory Visit: Payer: 59

## 2015-05-30 DIAGNOSIS — K769 Liver disease, unspecified: Secondary | ICD-10-CM

## 2015-06-04 ENCOUNTER — Ambulatory Visit: Payer: 59

## 2015-06-09 ENCOUNTER — Ambulatory Visit (HOSPITAL_COMMUNITY)
Admission: RE | Admit: 2015-06-09 | Discharge: 2015-06-09 | Disposition: A | Discharge status: Home / Self Care | Payer: 59 | Source: Ambulatory Visit | Attending: Oncology | Admitting: Oncology

## 2015-06-09 ENCOUNTER — Ambulatory Visit (HOSPITAL_COMMUNITY): Admission: RE | Admit: 2015-06-09 | Payer: 59 | Source: Ambulatory Visit

## 2015-06-09 DIAGNOSIS — M549 Dorsalgia, unspecified: Secondary | ICD-10-CM | POA: Diagnosis not present

## 2015-06-09 DIAGNOSIS — M4723 Other spondylosis with radiculopathy, cervicothoracic region: Secondary | ICD-10-CM | POA: Diagnosis not present

## 2015-06-09 DIAGNOSIS — M4726 Other spondylosis with radiculopathy, lumbar region: Secondary | ICD-10-CM | POA: Diagnosis not present

## 2015-06-09 DIAGNOSIS — M5134 Other intervertebral disc degeneration, thoracic region: Secondary | ICD-10-CM | POA: Diagnosis not present

## 2015-06-09 DIAGNOSIS — M5126 Other intervertebral disc displacement, lumbar region: Secondary | ICD-10-CM | POA: Insufficient documentation

## 2015-06-09 DIAGNOSIS — M5136 Other intervertebral disc degeneration, lumbar region: Secondary | ICD-10-CM | POA: Diagnosis not present

## 2015-06-09 DIAGNOSIS — M47814 Spondylosis without myelopathy or radiculopathy, thoracic region: Secondary | ICD-10-CM | POA: Diagnosis not present

## 2015-06-09 DIAGNOSIS — M5184 Other intervertebral disc disorders, thoracic region: Secondary | ICD-10-CM | POA: Insufficient documentation

## 2015-06-09 DIAGNOSIS — D1800 Hemangioma unspecified site: Secondary | ICD-10-CM | POA: Diagnosis not present

## 2015-06-09 MED ORDER — GADOBENATE DIMEGLUMINE 529 MG/ML IV SOLN
15.0000 mL | Freq: Once | INTRAVENOUS | Status: AC | PRN
  Administered 2015-06-09: 13 mL via INTRAVENOUS

## 2015-07-17 ENCOUNTER — Ambulatory Visit: Payer: Self-pay | Admitting: Physician Assistant

## 2015-07-17 ENCOUNTER — Encounter: Payer: Self-pay | Admitting: Physician Assistant

## 2015-07-17 VITALS — BP 100/80 | HR 80 | Temp 98.7°F

## 2015-07-17 DIAGNOSIS — G4700 Insomnia, unspecified: Secondary | ICD-10-CM

## 2015-07-17 DIAGNOSIS — J309 Allergic rhinitis, unspecified: Secondary | ICD-10-CM

## 2015-07-17 DIAGNOSIS — M549 Dorsalgia, unspecified: Secondary | ICD-10-CM

## 2015-07-17 DIAGNOSIS — G8929 Other chronic pain: Secondary | ICD-10-CM

## 2015-07-17 NOTE — Progress Notes (Signed)
S: needs med refill on ambien, not using it every night, just when she wakes up and can't get back to sleep, also some sinus drainage, ?allergies, also ?if we could refer her to PT, had mri done, ?of hemangiomas and metastatic disease, metastatic disease r/o by dr Oliva Bustard and ca center, now would like to strengthen her core  O: vitals wnl, ent wnl, neck supple no lymph, lungs c t a, cv rrr, back tender lumbar area around to hips, no loss of rom, n/v intact  A: insomnia, chronic back pain, allergic rhinitis  P: ambien, flonase, discuss PT with dr Oliva Bustard, due to hemangiomas and ease of fracture associated with them I am not comfortable ordering PT until a specialist approves

## 2015-07-20 DIAGNOSIS — B9689 Other specified bacterial agents as the cause of diseases classified elsewhere: Secondary | ICD-10-CM | POA: Diagnosis not present

## 2015-07-20 DIAGNOSIS — H6692 Otitis media, unspecified, left ear: Secondary | ICD-10-CM | POA: Diagnosis not present

## 2015-07-20 DIAGNOSIS — J208 Acute bronchitis due to other specified organisms: Secondary | ICD-10-CM | POA: Diagnosis not present

## 2015-08-03 DIAGNOSIS — R05 Cough: Secondary | ICD-10-CM | POA: Diagnosis not present

## 2015-08-03 DIAGNOSIS — H6983 Other specified disorders of Eustachian tube, bilateral: Secondary | ICD-10-CM | POA: Diagnosis not present

## 2015-08-03 DIAGNOSIS — R0989 Other specified symptoms and signs involving the circulatory and respiratory systems: Secondary | ICD-10-CM | POA: Diagnosis not present

## 2015-08-20 ENCOUNTER — Other Ambulatory Visit: Payer: 59

## 2015-09-22 ENCOUNTER — Ambulatory Visit: Payer: Self-pay | Admitting: Physician Assistant

## 2015-09-22 ENCOUNTER — Encounter: Payer: Self-pay | Admitting: Physician Assistant

## 2015-09-22 VITALS — BP 110/65 | HR 93 | Temp 98.7°F

## 2015-09-22 DIAGNOSIS — W57XXXA Bitten or stung by nonvenomous insect and other nonvenomous arthropods, initial encounter: Secondary | ICD-10-CM

## 2015-09-22 DIAGNOSIS — R509 Fever, unspecified: Secondary | ICD-10-CM

## 2015-09-22 LAB — POCT URINALYSIS DIPSTICK
Bilirubin, UA: NEGATIVE
Glucose, UA: NEGATIVE
KETONES UA: NEGATIVE
Nitrite, UA: NEGATIVE
PROTEIN UA: NEGATIVE
SPEC GRAV UA: 1.02
UROBILINOGEN UA: 0.2
pH, UA: 6.5

## 2015-09-22 MED ORDER — DOXYCYCLINE HYCLATE 100 MG PO TABS: 100.0000 mg | ORAL_TABLET | Freq: Two times a day (BID) | ORAL | 0 refills | Status: DC

## 2015-09-22 NOTE — Progress Notes (Signed)
S: c/o sweating at night, headache, joint pain , all for about 2 weeks, no daytime fever/chills, no cough or congestion, no cp/sob, states she has pulled several ticks off this year  O: vitals wnl, nad, ent wnl, neck supple no lymph lungs c t a, cv rrr, ua wnl  A: tick bite,   P: doxy 100mg  bid x 10d, f/u with pcp if not improving with antibiotic

## 2015-10-07 ENCOUNTER — Encounter: Payer: Self-pay | Admitting: Obstetrics and Gynecology

## 2015-10-07 ENCOUNTER — Ambulatory Visit (INDEPENDENT_AMBULATORY_CARE_PROVIDER_SITE_OTHER): Payer: 59 | Admitting: Obstetrics and Gynecology

## 2015-10-07 VITALS — BP 106/64 | HR 99 | Ht 68.0 in | Wt 148.6 lb

## 2015-10-07 DIAGNOSIS — Z1239 Encounter for other screening for malignant neoplasm of breast: Secondary | ICD-10-CM

## 2015-10-07 DIAGNOSIS — N809 Endometriosis, unspecified: Secondary | ICD-10-CM | POA: Diagnosis not present

## 2015-10-07 DIAGNOSIS — Z01419 Encounter for gynecological examination (general) (routine) without abnormal findings: Secondary | ICD-10-CM | POA: Diagnosis not present

## 2015-10-07 DIAGNOSIS — F419 Anxiety disorder, unspecified: Secondary | ICD-10-CM

## 2015-10-07 DIAGNOSIS — Z1211 Encounter for screening for malignant neoplasm of colon: Secondary | ICD-10-CM | POA: Diagnosis not present

## 2015-10-07 DIAGNOSIS — G4700 Insomnia, unspecified: Secondary | ICD-10-CM

## 2015-10-07 NOTE — Progress Notes (Signed)
ANNUAL PREVENTATIVE CARE GYN  ENCOUNTER NOTE  Subjective:       Kimberly Cantrell is a 51 y.o. married white female, para 3003, with history of fibroids and endometriosis, status post LAVH LSO and right salpingectomy, along with excisional biopsies of endometriosis, presents for routine annual gynecologic exam.  Current complaints: 1.   Wants labs  2. occas pelvis pain   Gynecologic History No LMP recorded. Patient has had a hysterectomy. LAVH LSO, right salpingectomy Contraception: status post hysterectomy Last Pap: 2012. Results were: normal Last mammogram: 01/2015 birad 1 Results were: normal History of endometriosis  Obstetric History OB History  No data available  Para 3003  Past Medical History:  Diagnosis Date  . Bone lesion 02/07/2015  . DDD (degenerative disc disease), cervical   . DJD (degenerative joint disease) of cervical spine   . Endometriosis   . Vitamin D deficiency     Past Surgical History:  Procedure Laterality Date  . ABDOMINAL HYSTERECTOMY    . CHOLECYSTECTOMY  2007  . HERNIA REPAIR     age 59  . NASAL SINUS SURGERY     age 40  . OOPHORECTOMY    . PARTIAL HYSTERECTOMY     1 ovary remaining    Current Outpatient Prescriptions on File Prior to Visit  Medication Sig Dispense Refill  . doxycycline (VIBRA-TABS) 100 MG tablet Take 1 tablet (100 mg total) by mouth 2 (two) times daily. 20 tablet 0  . Naproxen Sodium (ALEVE) 220 MG CAPS Take 1-2 capsule by mouth three times a day as needed for pain.    Marland Kitchen zolpidem (AMBIEN) 10 MG tablet Take 1 tablet (10 mg total) by mouth at bedtime as needed for sleep. 30 tablet 3   No current facility-administered medications on file prior to visit.     Allergies  Allergen Reactions  . Metronidazole Other (See Comments)  . Clindamycin Hcl Rash  . Ketoconazole Rash    Social History   Social History  . Marital status: Married    Spouse name: N/A  . Number of children: N/A  . Years of education: N/A    Occupational History  . Not on file.   Social History Main Topics  . Smoking status: Never Smoker  . Smokeless tobacco: Never Used  . Alcohol use No  . Drug use: No  . Sexual activity: Yes    Partners: Male   Other Topics Concern  . Not on file   Social History Narrative  . No narrative on file    Family History  Problem Relation Age of Onset  . Heart attack Father     The following portions of the patient's history were reviewed and updated as appropriate: allergies, current medications, past family history, past medical history, past social history, past surgical history and problem list.  Review of Systems ROS Review of Systems - General ROS: negative for - chills, fatigue, fever, hot flashes, night sweats, weight gain or weight loss Psychological ROS: negative for - anxiety, decreased libido, depression, mood swings, physical abuse or sexual abuse Ophthalmic ROS: negative for - blurry vision, eye pain or loss of vision ENT ROS: negative for - headaches, hearing change, visual changes or vocal changes Allergy and Immunology ROS: negative for - hives, itchy/watery eyes or seasonal allergies Hematological and Lymphatic ROS: negative for - bleeding problems, bruising, swollen lymph nodes or weight loss Endocrine ROS: negative for - galactorrhea, hair pattern changes, hot flashes, malaise/lethargy, mood swings, palpitations, polydipsia/polyuria, skin changes, temperature intolerance  or unexpected weight changes Breast ROS: negative for - new or changing breast lumps or nipple discharge Respiratory ROS: negative for - cough or shortness of breath Cardiovascular ROS: negative for - chest pain, irregular heartbeat, palpitations or shortness of breath Gastrointestinal ROS: no abdominal pain, change in bowel habits, or black or bloody stools Genito-Urinary ROS: no dysuria, trouble voiding, or hematuria Musculoskeletal ROS: negative for - joint pain or joint  stiffness Neurological ROS: negative for - bowel and bladder control changes Dermatological ROS: negative for rash and skin lesion changes   Objective:   There were no vitals taken for this visit. CONSTITUTIONAL: Well-developed, well-nourished female in no acute distress.  PSYCHIATRIC: Normal mood and affect. Normal behavior. Normal judgment and thought content. [place]: Alert and oriented to person, place, and time. Normal muscle tone coordination. No cranial nerve deficit noted. HENT:  Normocephalic, atraumatic, External right and left ear normal. Oropharynx is clear and moist EYES: Conjunctivae and EOM are normal. Pupils are equal, round, and reactive to light. No scleral icterus.  NECK: Normal range of motion, supple, no masses.  Normal thyroid.  SKIN: Skin is warm and dry. No rash noted. Not diaphoretic. No erythema. No pallor. CARDIOVASCULAR: Normal heart rate noted, regular rhythm, no murmur. RESPIRATORY: Clear to auscultation bilaterally. Effort and breath sounds normal, no problems with respiration noted. BREASTS: Symmetric in size. No masses, skin changes, nipple drainage, or lymphadenopathy. ABDOMEN: Soft, normal bowel sounds, no distention noted.  No tenderness, rebound or guarding.  BLADDER: Normal PELVIC:  External Genitalia: Normal  BUS: Normal  Vagina: Normal  Cervix: Surgically absent  Uterus: Surgically absent  Adnexa: Normal; mild right lower quadrant tenderness without palpable mass  RV: External Exam NormaI, No Rectal Masses and Normal Sphincter tone  MUSCULOSKELETAL: Normal range of motion. No tenderness.  No cyanosis, clubbing, or edema.  2+ distal pulses. LYMPHATIC: No Axillary, Supraclavicular, or Inguinal Adenopathy.    Assessment:   Annual gynecologic examination 50 y.o. Contraception: status post hysterectomy LAVH LSO and right salpingectomy Normal BMI Problem List Items Addressed This Visit    Well woman exam with routine gynecological exam -  Primary   Endometriosis    Other Visit Diagnoses    Insomnia       Anxiety          Plan:  Pap: Not needed Mammogram: Ordered Stool Guaiac Testing:  colonoscopy due age 72 Labs: vit d tsh a1c fbs lipid Routine preventative health maintenance measures emphasized: Exercise/Diet/Weight control, Tobacco Warnings, Alcohol/Substance use risks and Stress Management Return to [place], CMA  Brayton Mars, MD  Note: This dictation was prepared with Dragon dictation along with smaller phrase technology. Any transcriptional errors that result from this process are unintentional.

## 2015-10-07 NOTE — Patient Instructions (Signed)
1. No Pap smear needed 2. Self breast exam encouraged; mammogram ordered 3. Consider colonoscopy after age 50 4. Continue with healthy eating and exercise 5. Recommend calcium with vitamin D supplementation daily 6. Return in 1 year for annual exam

## 2015-10-08 ENCOUNTER — Other Ambulatory Visit: Payer: 59

## 2015-10-08 DIAGNOSIS — Z01419 Encounter for gynecological examination (general) (routine) without abnormal findings: Secondary | ICD-10-CM | POA: Diagnosis not present

## 2015-10-10 LAB — GLUCOSE, RANDOM: GLUCOSE: 91 mg/dL (ref 65–99)

## 2015-10-10 LAB — LIPID PANEL
CHOLESTEROL TOTAL: 231 mg/dL — AB (ref 100–199)
Chol/HDL Ratio: 4.4 ratio units (ref 0.0–4.4)
HDL: 52 mg/dL (ref >39)
LDL Calculated: 140 mg/dL — ABNORMAL HIGH (ref 0–99)
Triglycerides: 194 mg/dL — ABNORMAL HIGH (ref 0–149)
VLDL CHOLESTEROL CAL: 39 mg/dL (ref 5–40)

## 2015-10-10 LAB — HEMOGLOBIN A1C
ESTIMATED AVERAGE GLUCOSE: 100 mg/dL
Hgb A1c MFr Bld: 5.1 % (ref 4.8–5.6)

## 2015-10-10 LAB — VITAMIN D 25 HYDROXY (VIT D DEFICIENCY, FRACTURES): Vit D, 25-Hydroxy: 15.9 ng/mL — ABNORMAL LOW (ref 30.0–100.0)

## 2015-10-10 LAB — TSH: TSH: 3.27 u[IU]/mL (ref 0.450–4.500)

## 2016-01-19 ENCOUNTER — Other Ambulatory Visit: Payer: Self-pay | Admitting: Physician Assistant

## 2016-06-18 ENCOUNTER — Telehealth: Payer: Self-pay | Admitting: Obstetrics and Gynecology

## 2016-06-18 NOTE — Telephone Encounter (Signed)
Patient called stating she has not been sleeping well and wanted to know if she can get a script for [place]. Thanks

## 2016-06-21 NOTE — Telephone Encounter (Signed)
VM full. Can not leave a message. Mad will have to approve.

## 2016-06-22 MED ORDER — ZOLPIDEM TARTRATE 10 MG PO TABS: 10.0000 mg | ORAL_TABLET | Freq: Once | ORAL | 0 refills | Status: DC

## 2016-06-22 NOTE — Telephone Encounter (Signed)
vm full. Per mad ok to refill ambien #20 only. Rx printed and signed. Will try pt again.

## 2016-06-23 NOTE — Telephone Encounter (Addendum)
Mail box full. Can not leave message. Tried to contact x3. Will leave rx up front. Chart filed.

## 2016-06-25 ENCOUNTER — Other Ambulatory Visit: Payer: Self-pay

## 2016-07-10 IMAGING — CT CT ABD-PELV W/ CM
2 of 5 series · 12 of 46 positions shown, 14 images · IV contrast (omnipaque)
Comparison: No comparison studies available.

CLINICAL DATA: Subsequent encounter for 16 mm T12 lesion on recent
lumbar spine MRI raises the question of myeloma or metastatic
involvement.

EXAM:
CT CHEST, ABDOMEN, AND PELVIS WITH CONTRAST
TECHNIQUE: Multidetector CT imaging of the chest, abdomen and pelvis was
performed following the standard protocol during bolus
administration of intravenous contrast.
CONTRAST:  100mL OMNIPAQUE IOHEXOL 300 MG/ML  SOLN

[Series 2: cap with · axial · 0.69mm/px · z∈[-989,-389]mm · 9 of 138 slices shown, 11 images]
[im 9/138  soft-tissue]
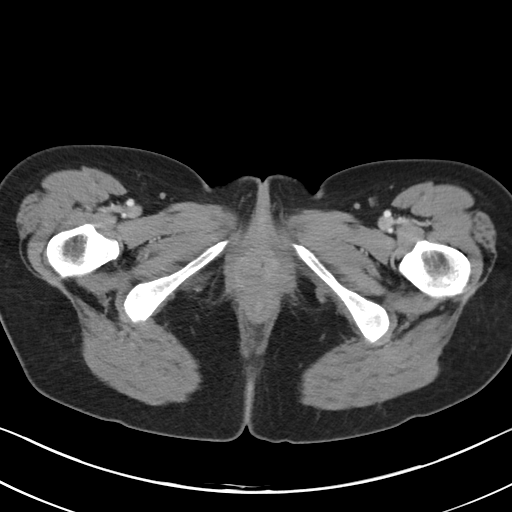
[im 9/138  bone]
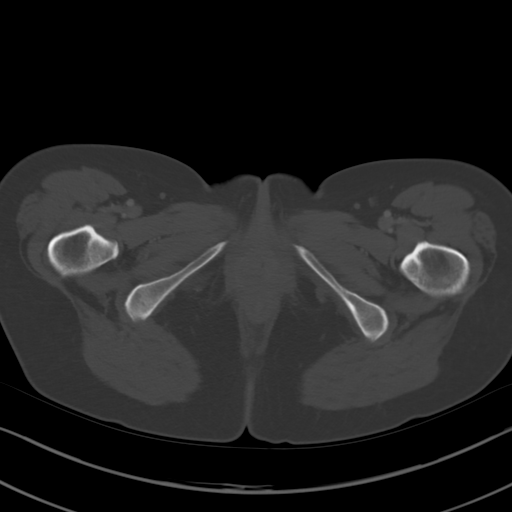
[im 25/138  soft-tissue]
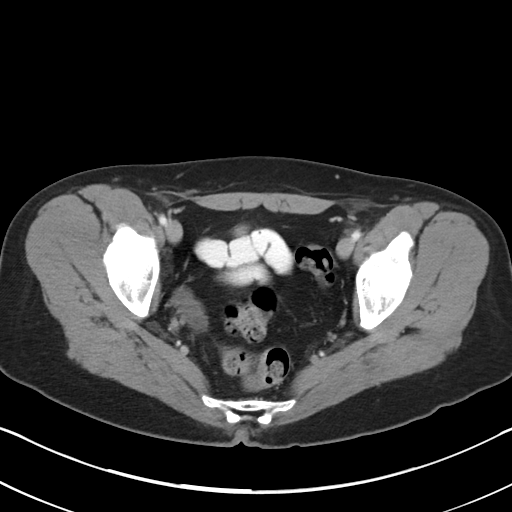
[im 41/138  soft-tissue]
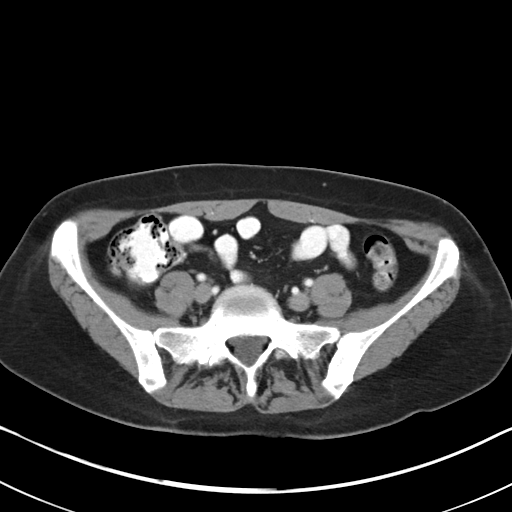
[im 57/138  soft-tissue]
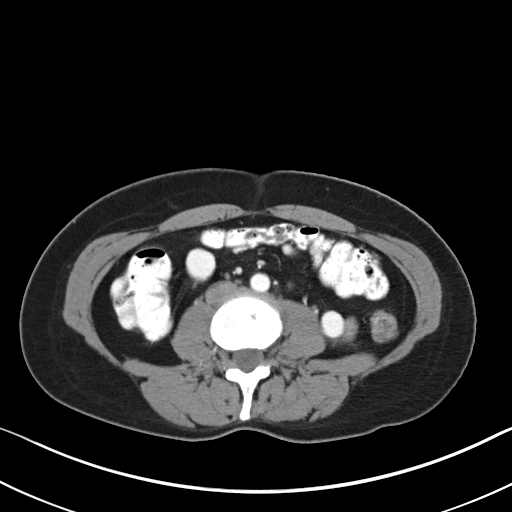
[im 73/138  soft-tissue]
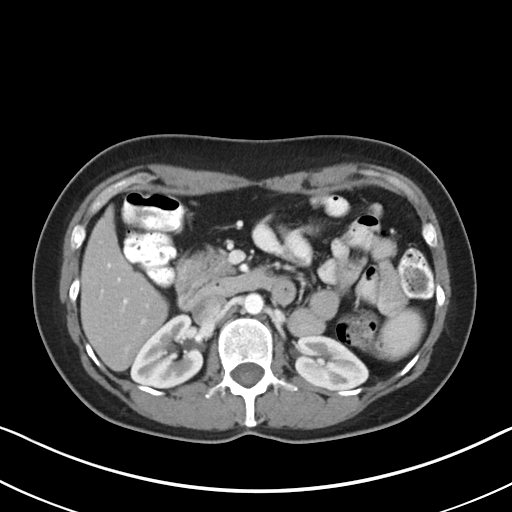
[im 81/138  soft-tissue]
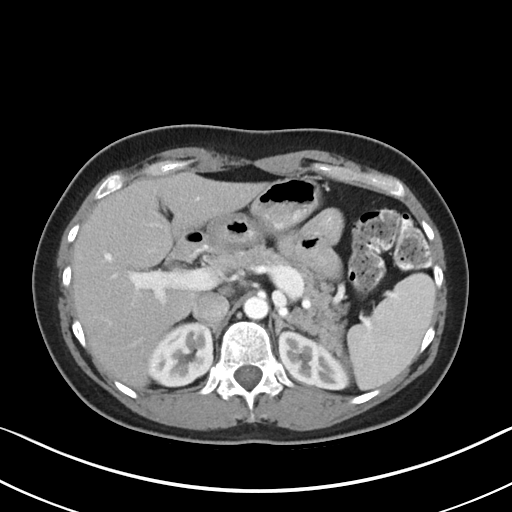
[im 97/138  soft-tissue]
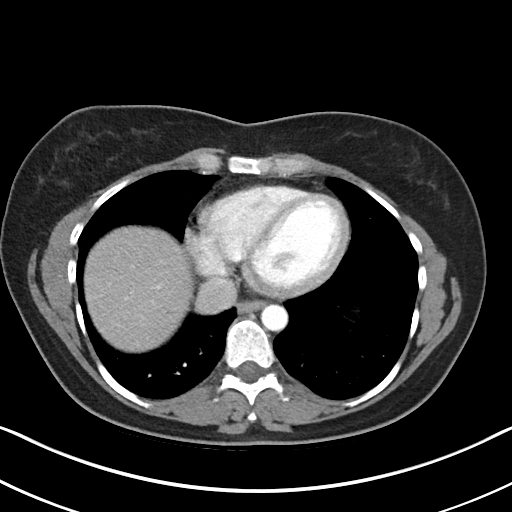
[im 113/138  soft-tissue]
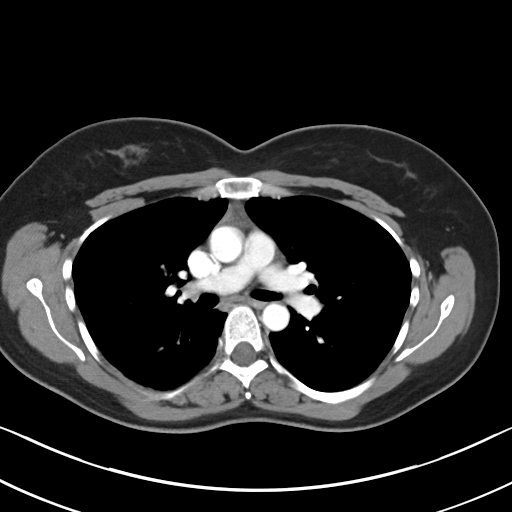
[im 129/138  soft-tissue]
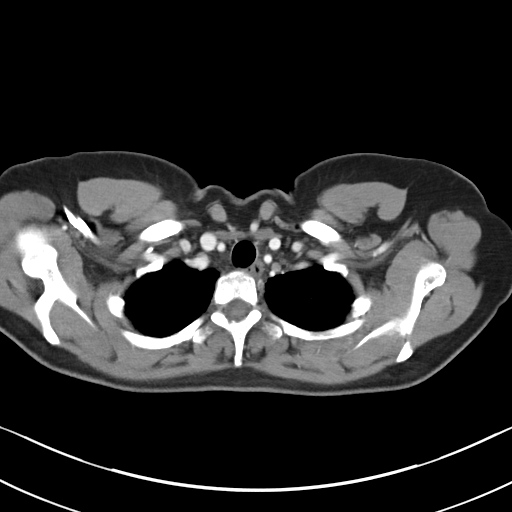
[im 129/138  bone]
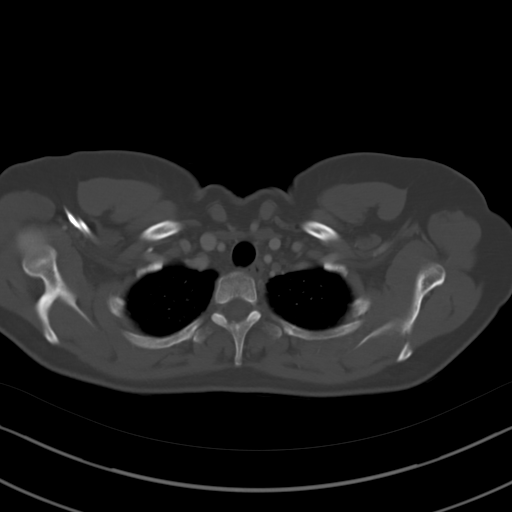

[Series 6: cor cap with · coronal · 0.70mm/px · 3 of 130 slices shown]
[im 44/130  soft-tissue]
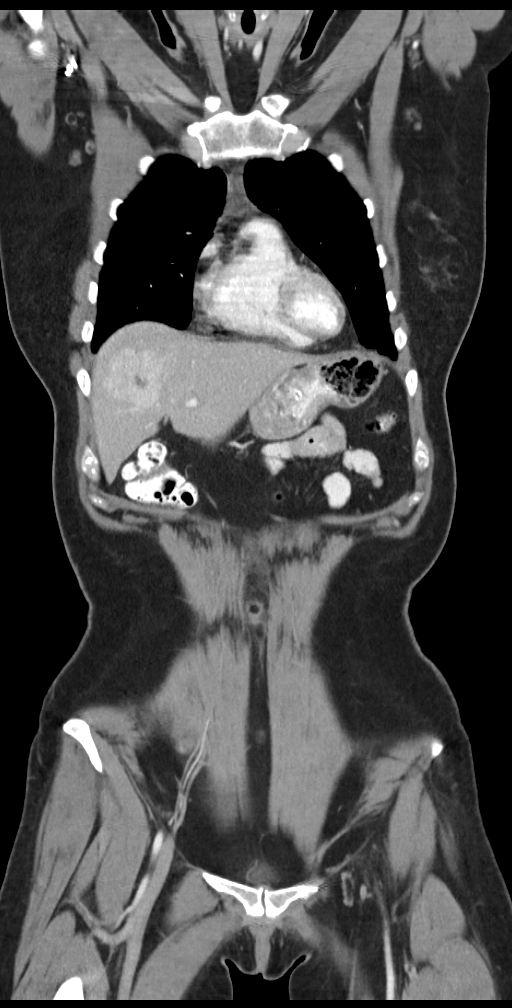
[im 58/130  soft-tissue]
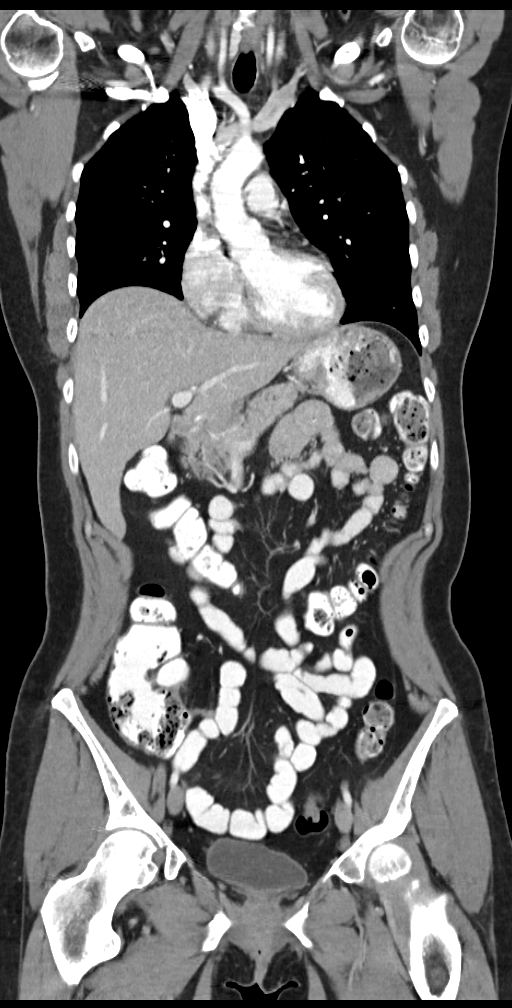
[im 72/130  soft-tissue]
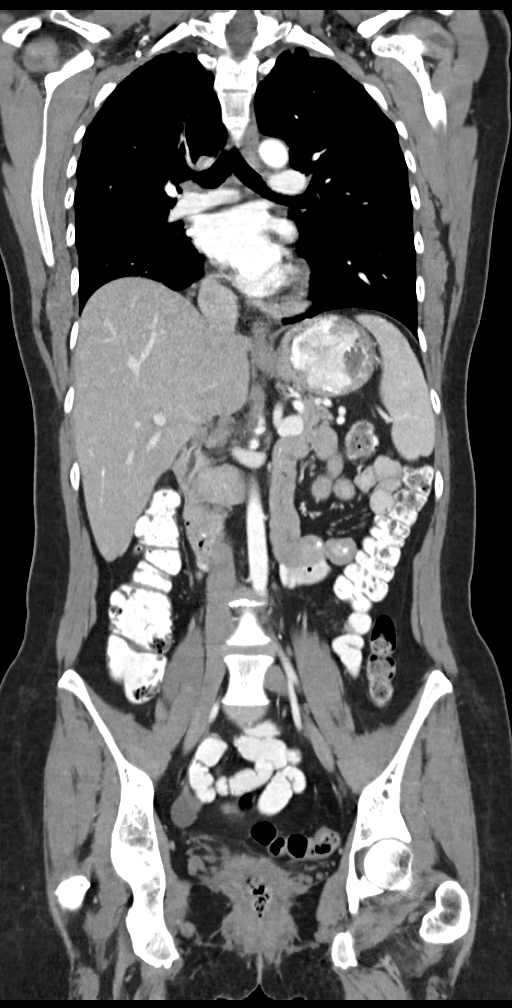

[12 of 46 positions shown; findings below may reference images not displayed]

FINDINGS: CT CHEST FINDINGS

Mediastinum/Lymph Nodes: There is no axillary lymphadenopathy. No
mediastinal lymphadenopathy. There is no hilar lymphadenopathy. The
heart size is normal. No pericardial effusion.

Lungs/Pleura: Lungs are unremarkable without focal airspace
consolidation. No pulmonary parenchymal nodule or mass. No pulmonary
edema or pleural effusion. Dependent atelectasis noted in the lower
lungs bilaterally.

Musculoskeletal: Bone windows reveal no worrisome lytic or sclerotic
osseous lesions.

CT ABDOMEN PELVIS FINDINGS

Hepatobiliary: 3.5 x 3.8 cm hypervascular lesion with central scar
is identified in the medial segment left liver anteriorly. A second
hypervascular lesion is seen in the posterior right hepatic dome
measuring 1.6 cm. Both lesions become isoattenuating to liver
parenchyma on renal delay sequence. Gallbladder surgically absent.
No intrahepatic or extrahepatic biliary dilation.

Pancreas: No focal mass lesion. No dilatation of the main duct. No
intraparenchymal cyst. No peripancreatic edema.

Spleen: No splenomegaly. No focal mass lesion.

Adrenals/Urinary Tract: No adrenal nodule or mass. Tiny hypo
enhancing lesions in the right kidney are too small to characterize
but likely represent tiny cyst. Left kidney is unremarkable. No
evidence for hydroureter. The urinary bladder appears normal for the
degree of distention.

Stomach/Bowel: Stomach is nondistended. No gastric wall thickening.
No evidence of outlet obstruction. Duodenum is normally positioned
as is the ligament of Treitz. No small bowel wall thickening. No
small bowel dilatation. The terminal ileum is normal. The appendix
is normal. No gross colonic mass. No colonic wall thickening. No
substantial diverticular change.

Vascular/Lymphatic: No abdominal aortic aneurysm. No abdominal
atherosclerotic calcification. There is no gastrohepatic or
hepatoduodenal ligament lymphadenopathy. No intraperitoneal or
retroperitoneal lymphadenopy. No pelvic sidewall lymphadenopathy.

Reproductive: Uterus is surgically absent no evidence for left
adnexal mass. 2.0 x 4.0 cm soft tissue structure in the right
adnexal space is compatible with the right ovary and may contain
small cysts or dominant follicles.

Other: No intraperitoneal free fluid.

Musculoskeletal: There is subtle increase mineralization in the
right aspect of the T12 vertebral body in the region of the MRI
abnormality. This is not definitely thickened trabeculation as
typically seen in cases of hemangioma.
IMPRESSION: 1. Unremarkable CT evaluation of the chest without evidence of
primary malignancy.
2. No primary malignancy identified in the abdomen or pelvis.
[DATE] hypervascular lesions identified within the liver parenchyma.
These are likely FNH, but MRI of the abdomen without and with
contrast is recommended to confirm. This follow-up MRI should be
performed with Eovist contrast material.
4. Probable tiny right renal cysts.
5. Prominent right ovary with apparent dominant follicles or small
cysts. While not overtly concerning for ovarian neoplasm, pelvic
ultrasound may prove helpful to further evaluate.
6. Subtle increase mineralization in the right aspect the T12
vertebral body is nonspecific.

## 2016-08-03 ENCOUNTER — Encounter: Payer: 59 | Admitting: Obstetrics and Gynecology

## 2016-09-26 ENCOUNTER — Encounter: Payer: Self-pay | Admitting: Emergency Medicine

## 2016-09-26 ENCOUNTER — Emergency Department
Admission: EM | Admit: 2016-09-26 | Discharge: 2016-09-26 | Disposition: A | Discharge status: Home / Self Care | Payer: BC Managed Care – PPO | Attending: Emergency Medicine | Admitting: Emergency Medicine

## 2016-09-26 DIAGNOSIS — Z203 Contact with and (suspected) exposure to rabies: Secondary | ICD-10-CM

## 2016-09-26 DIAGNOSIS — Z2914 Encounter for prophylactic rabies immune globin: Secondary | ICD-10-CM | POA: Diagnosis present

## 2016-09-26 DIAGNOSIS — Z23 Encounter for immunization: Secondary | ICD-10-CM | POA: Diagnosis not present

## 2016-09-26 MED ORDER — RABIES IMMUNE GLOBULIN 150 UNIT/ML IM INJ
20.0000 [IU]/kg | INJECTION | Freq: Once | INTRAMUSCULAR | Status: AC
  Administered 2016-09-26: 1350 [IU] via INTRAMUSCULAR
  Filled 2016-09-26: qty 9

## 2016-09-26 MED ORDER — RABIES VACCINE, PCEC IM SUSR
1.0000 mL | Freq: Once | INTRAMUSCULAR | Status: AC
  Administered 2016-09-26: 1 mL via INTRAMUSCULAR
  Filled 2016-09-26: qty 1

## 2016-09-26 NOTE — ED Triage Notes (Addendum)
Pt cat was exposed to rabid racoon.  Racoon was tested and positive. No known bite/scratches per vet but was exposed to saliva per report; family then handled cat.  Here for rabies vaccine.

## 2016-09-26 NOTE — ED Provider Notes (Signed)
[facility] Emergency Department Provider Note ____________________________________________  Time seen: 1740  I have reviewed the triage vital signs and the nursing notes.  HISTORY  Chief Complaint  Rabies Injection  HPI Kimberly Cantrell is a 51 y.o. female Patient is present with her 2 children for rabies vaccine and immunoglobulin administration. They are here at the request of the vet for the family's cat. The cat was attacked by a confirmed-rabid raccoon.The patient and her family handled the cat post-attack, which occurred 8 days prior to arrival. No bite or direct exposure to the raccoon is reported.   Past Medical History:  Diagnosis Date  . Bone lesion 02/07/2015  . DDD (degenerative disc disease), cervical   . DJD (degenerative joint disease) of cervical spine   . Endometriosis   . Vitamin D deficiency     Patient Active Problem List   Diagnosis Date Noted  . Well woman exam with routine gynecological exam 10/07/2015  . Endometriosis 10/07/2015  . Bone lesion 02/07/2015    Past Surgical History:  Procedure Laterality Date  . ABDOMINAL HYSTERECTOMY    . CHOLECYSTECTOMY  2007  . HERNIA REPAIR     age 89  . NASAL SINUS SURGERY     age 81  . OOPHORECTOMY    . PARTIAL HYSTERECTOMY     1 ovary remaining    Prior to Admission medications   Medication Sig Start Date End Date Taking? Authorizing Provider  albuterol (PROVENTIL HFA;VENTOLIN HFA) 108 (90 Base) MCG/ACT inhaler Inhale into the lungs. 08/03/15   [provider]  dicyclomine (BENTYL) 20 MG tablet Take 20 mg by mouth every 6 (six) hours.    [provider]  esomeprazole (NEXIUM) 40 MG capsule Take by mouth. 04/01/15   [provider]  Naproxen Sodium (ALEVE) 220 MG CAPS Take 1-2 capsule by mouth three times a day as needed for pain.    [provider]  zolpidem (AMBIEN) 10 MG tablet Take 1 tablet (10 mg total) by mouth once. 06/22/16 06/22/16  Defrancesco,  Alanda Slim, MD    Allergies Metronidazole; Clindamycin hcl; and Ketoconazole  Family History  Problem Relation Age of Onset  . Heart attack Father   . Diabetes Father   . Diabetes Maternal Aunt   . Diabetes Maternal Uncle   . Diabetes Paternal Uncle   . Diabetes Maternal Grandmother   . Diabetes Paternal Grandmother   . Cancer Neg Hx   . Ovarian cancer Neg Hx     Social History Social History  Substance Use Topics  . Smoking status: Never Smoker  . Smokeless tobacco: Never Used  . Alcohol use No    Review of Systems  Constitutional: Negative for fever. Cardiovascular: Negative for chest pain. Respiratory: Negative for shortness of breath. Gastrointestinal: Negative for abdominal pain, vomiting and diarrhea. Musculoskeletal: Negative for back pain. Skin: Negative for rash. Neurological: Negative for headaches, focal weakness or numbness. ____________________________________________  PHYSICAL EXAM:  VITAL SIGNS: ED Triage Vitals [09/26/16 1732]  Enc Vitals Group     BP 118/67     Pulse Rate (!) 103     Resp 16     Temp 98.6 F (37 C)     Temp Source Oral     SpO2 96 %     Weight 145 lb (65.8 kg)     Height      Head Circumference      Peak Flow      Pain Score  Pain Loc      Pain Edu?      Excl. in [place]?     Constitutional: Alert and oriented. Well appearing and in no distress. Head: Normocephalic and atraumatic. Eyes: Conjunctivae are normal. Normal extraocular movements Cardiovascular: Normal rate, regular rhythm. Normal distal pulses. Respiratory: Normal respiratory effort. No wheezes/rales/rhonchi. Musculoskeletal: Nontender with normal range of motion in all extremities.  Neurologic:  Normal gait without ataxia. Normal speech and language. No gross focal neurologic deficits are appreciated. Skin:  Skin is warm, dry and intact. No rash noted. ____________________________________________  PROCEDURES  Rabies Vaccine 1 ml IM Rabies immune  globulin 1350 units (9 ml) IM ____________________________________________  INITIAL IMPRESSION / ASSESSMENT AND PLAN / ED COURSE  Patient presents to the ED for post exposure prophylaxis secondary to her rabies encounter. Patient will follow-up with primary care provider or [facility] for the remainder of the rabies vaccine series. Follow-up with your provider or [facility] for:  - Vaccine 2 of 4 on 09/29/16 (Day 3)  - Vaccine 3 of 4 on 10/03/16 (Day 7)  - Vaccine 4 of 4 on 10/10/16 (Day 14) ____________________________________________  FINAL CLINICAL IMPRESSION(S) / ED DIAGNOSES  Final diagnoses:  Need for post exposure prophylaxis for rabies      Melvenia Needles, PA-C 09/26/16 1827    Delman Kitten, MD 09/26/16 2043

## 2016-09-26 NOTE — Discharge Instructions (Signed)
Follow-up with your provider or [facility] [facility] for:  - Vaccine 2 of 4 on 09/29/16 (Day 3)  - Vaccine 3 of 4 on 10/03/16 (Day 7)  - Vaccine 4 of 4 on 10/10/16 (Day 14)

## 2016-09-26 NOTE — ED Notes (Signed)
Pt has no sx of rabies. Cat was exposed to saliva of rabid racoon.  Pt then handled cat

## 2016-09-29 ENCOUNTER — Ambulatory Visit
Admission: EM | Admit: 2016-09-29 | Discharge: 2016-09-29 | Disposition: A | Discharge status: Home / Self Care | Payer: BC Managed Care – PPO | Attending: Family Medicine | Admitting: Family Medicine

## 2016-09-29 DIAGNOSIS — Z2914 Encounter for prophylactic rabies immune globin: Secondary | ICD-10-CM

## 2016-09-29 DIAGNOSIS — Z203 Contact with and (suspected) exposure to rabies: Secondary | ICD-10-CM | POA: Diagnosis not present

## 2016-09-29 MED ORDER — RABIES VACCINE, PCEC IM SUSR
1.0000 mL | Freq: Once | INTRAMUSCULAR | Status: AC
  Administered 2016-09-29: 1 mL via INTRAMUSCULAR

## 2016-09-29 NOTE — ED Notes (Signed)
Patient shows no signs of adverse reaction to medication at this time.  

## 2016-09-29 NOTE — ED Triage Notes (Signed)
Pt here for rabies vaccine #2

## 2016-09-29 NOTE — Discharge Instructions (Signed)
Return 10/03/2016 for rabies vaccine #3

## 2016-10-03 ENCOUNTER — Ambulatory Visit
Admission: EM | Admit: 2016-10-03 | Discharge: 2016-10-03 | Disposition: A | Discharge status: Home / Self Care | Payer: BC Managed Care – PPO | Attending: Family Medicine | Admitting: Family Medicine

## 2016-10-03 DIAGNOSIS — Z2914 Encounter for prophylactic rabies immune globin: Secondary | ICD-10-CM

## 2016-10-03 DIAGNOSIS — Z203 Contact with and (suspected) exposure to rabies: Secondary | ICD-10-CM

## 2016-10-03 MED ORDER — RABIES VACCINE, PCEC IM SUSR
1.0000 mL | Freq: Once | INTRAMUSCULAR | Status: AC
  Administered 2016-10-03: 1 mL via INTRAMUSCULAR

## 2016-10-03 NOTE — ED Notes (Signed)
Patient has had no adverse signs or symptoms to 3rd rabies vaccine. Patient will return for 4 injection in series on 10/10/2016

## 2016-10-03 NOTE — ED Triage Notes (Signed)
Patient is here for 3rd Rabies injection in series. Patient will return here for 4th series on 10/10/2016

## 2016-10-07 ENCOUNTER — Encounter: Payer: 59 | Admitting: Obstetrics and Gynecology

## 2016-10-10 ENCOUNTER — Encounter: Payer: Self-pay | Admitting: Gynecology

## 2016-10-10 ENCOUNTER — Ambulatory Visit
Admission: EM | Admit: 2016-10-10 | Discharge: 2016-10-10 | Disposition: A | Discharge status: Home / Self Care | Payer: BC Managed Care – PPO | Attending: Family Medicine | Admitting: Family Medicine

## 2016-10-10 DIAGNOSIS — Z23 Encounter for immunization: Secondary | ICD-10-CM

## 2016-10-10 DIAGNOSIS — Z203 Contact with and (suspected) exposure to rabies: Secondary | ICD-10-CM | POA: Diagnosis not present

## 2016-10-10 MED ORDER — RABIES VACCINE, PCEC IM SUSR
1.0000 mL | Freq: Once | INTRAMUSCULAR | Status: AC
  Administered 2016-10-10: 1 mL via INTRAMUSCULAR

## 2016-10-10 NOTE — ED Triage Notes (Signed)
4 th Rabies injection.

## 2016-10-11 NOTE — Progress Notes (Deleted)
ANNUAL PREVENTATIVE CARE GYN  ENCOUNTER NOTE  Subjective:       Kimberly Cantrell is a 51 y.o. married white female, para 3003, with history of fibroids and endometriosis, status post LAVH LSO and right salpingectomy, along with excisional biopsies of endometriosis, presents for routine annual gynecologic exam.  Current complaints:   Gynecologic History No LMP recorded. Patient has had a hysterectomy. LAVH LSO, right salpingectomy Contraception: status post hysterectomy Last Pap: 2012. Results were: normal Last mammogram: 01/2015 birad 1 Results were: normal History of endometriosis  Obstetric History OB History  Gravida Para Term Preterm AB Living  3 3 3     3   SAB TAB Ectopic Multiple Live Births          3    # Outcome Date GA Lbr Len/2nd Weight Sex Delivery Anes PTL Lv  3 Term 2003    F Vag-Spont   LIV  2 Term 2000    M Vag-Spont   LIV  1 Term 1998    F Vag-Spont   LIV    Para 3003  Past Medical History:  Diagnosis Date  . Bone lesion 02/07/2015  . DDD (degenerative disc disease), cervical   . DJD (degenerative joint disease) of cervical spine   . Endometriosis   . Vitamin D deficiency     Past Surgical History:  Procedure Laterality Date  . ABDOMINAL HYSTERECTOMY    . CHOLECYSTECTOMY  2007  . HERNIA REPAIR     age 2  . NASAL SINUS SURGERY     age 12  . OOPHORECTOMY    . PARTIAL HYSTERECTOMY     1 ovary remaining    Current Outpatient Prescriptions on File Prior to Visit  Medication Sig Dispense Refill  . albuterol (PROVENTIL HFA;VENTOLIN HFA) 108 (90 Base) MCG/ACT inhaler Inhale into the lungs.    . dicyclomine (BENTYL) 20 MG tablet Take 20 mg by mouth every 6 (six) hours.    Marland Kitchen esomeprazole (NEXIUM) 40 MG capsule Take by mouth.    . Naproxen Sodium (ALEVE) 220 MG CAPS Take 1-2 capsule by mouth three times a day as needed for pain.    Marland Kitchen zolpidem (AMBIEN) 10 MG tablet Take 1 tablet (10 mg total) by mouth once. 20 tablet 0   No current facility-administered  medications on file prior to visit.     Allergies  Allergen Reactions  . Metronidazole Other (See Comments)  . Clindamycin Hcl Rash  . Ketoconazole Rash    Social History   Social History  . Marital status: Married    Spouse name: N/A  . Number of children: N/A  . Years of education: N/A   Occupational History  . Not on file.   Social History Main Topics  . Smoking status: Never Smoker  . Smokeless tobacco: Never Used  . Alcohol use No  . Drug use: No  . Sexual activity: Yes    Partners: Male    Birth control/ protection: Surgical   Other Topics Concern  . Not on file   Social History Narrative  . No narrative on file    Family History  Problem Relation Age of Onset  . Heart attack Father   . Diabetes Father   . Diabetes Maternal Aunt   . Diabetes Maternal Uncle   . Diabetes Paternal Uncle   . Diabetes Maternal Grandmother   . Diabetes Paternal Grandmother   . Cancer Neg Hx   . Ovarian cancer Neg Hx     The  following portions of the patient's history were reviewed and updated as appropriate: allergies, current medications, past family history, past medical history, past social history, past surgical history and problem list.  Review of Systems ROS  Objective:   There were no vitals taken for this visit. CONSTITUTIONAL: Well-developed, well-nourished female in no acute distress.  PSYCHIATRIC: Normal mood and affect. Normal behavior. Normal judgment and thought content. [place]: Alert and oriented to person, place, and time. Normal muscle tone coordination. No cranial nerve deficit noted. HENT:  Normocephalic, atraumatic, External right and left ear normal. Oropharynx is clear and moist EYES: Conjunctivae and EOM are normal. Pupils are equal, round, and reactive to light. No scleral icterus.  NECK: Normal range of motion, supple, no masses.  Normal thyroid.  SKIN: Skin is warm and dry. No rash noted. Not diaphoretic. No erythema. No  pallor. CARDIOVASCULAR: Normal heart rate noted, regular rhythm, no murmur. RESPIRATORY: Clear to auscultation bilaterally. Effort and breath sounds normal, no problems with respiration noted. BREASTS: Symmetric in size. No masses, skin changes, nipple drainage, or lymphadenopathy. ABDOMEN: Soft, normal bowel sounds, no distention noted.  No tenderness, rebound or guarding.  BLADDER: Normal PELVIC:  External Genitalia: Normal  BUS: Normal  Vagina: Normal  Cervix: Surgically absent  Uterus: Surgically absent  Adnexa: Normal; mild right lower quadrant tenderness without palpable mass  RV: External Exam NormaI, No Rectal Masses and Normal Sphincter tone  MUSCULOSKELETAL: Normal range of motion. No tenderness.  No cyanosis, clubbing, or edema.  2+ distal pulses. LYMPHATIC: No Axillary, Supraclavicular, or Inguinal Adenopathy.    Assessment:   Annual gynecologic examination 51 y.o. Contraception: status post hysterectomy LAVH LSO and right salpingectomy Normal BMI Problem List Items Addressed This Visit    Well woman exam with routine gynecological exam - Primary   Endometriosis    Other Visit Diagnoses    Screening for colon cancer       Anxiety       Screening for breast cancer          Plan:  Pap: Not needed Mammogram: Ordered Stool Guaiac Testing:  colonoscopy due age 41 Labs: vit d tsh a1c fbs lipid Routine preventative health maintenance measures emphasized: Exercise/Diet/Weight control, Tobacco Warnings, Alcohol/Substance use risks and Stress Management Return to [place], [place]   Note: This dictation was prepared with Dragon dictation along with smaller phrase technology. Any transcriptional errors that result from this process are unintentional.

## 2016-10-12 ENCOUNTER — Encounter: Payer: 59 | Admitting: Obstetrics and Gynecology

## 2016-10-25 NOTE — Progress Notes (Deleted)
ANNUAL PREVENTATIVE CARE GYN  ENCOUNTER NOTE  Subjective:       Kimberly Cantrell is a 51 y.o. married white female, para 3003, with history of fibroids and endometriosis, status post LAVH LSO and right salpingectomy, along with excisional biopsies of endometriosis, presents for routine annual gynecologic exam.  Current complaints:   Gynecologic History No LMP recorded. Patient has had a hysterectomy. LAVH LSO, right salpingectomy Contraception: status post hysterectomy Last Pap: 2012. Results were: normal Last mammogram: 01/2015 birad 1 Results were: normal History of endometriosis  Obstetric History OB History  Gravida Para Term Preterm AB Living  3 3 3     3   SAB TAB Ectopic Multiple Live Births          3    # Outcome Date GA Lbr Len/2nd Weight Sex Delivery Anes PTL Lv  3 Term 2003    F Vag-Spont   LIV  2 Term 2000    M Vag-Spont   LIV  1 Term 1998    F Vag-Spont   LIV    Para 3003  Past Medical History:  Diagnosis Date  . Bone lesion 02/07/2015  . DDD (degenerative disc disease), cervical   . DJD (degenerative joint disease) of cervical spine   . Endometriosis   . Vitamin D deficiency     Past Surgical History:  Procedure Laterality Date  . ABDOMINAL HYSTERECTOMY    . CHOLECYSTECTOMY  2007  . HERNIA REPAIR     age 51  . NASAL SINUS SURGERY     age 51  . OOPHORECTOMY    . PARTIAL HYSTERECTOMY     1 ovary remaining    Current Outpatient Prescriptions on File Prior to Visit  Medication Sig Dispense Refill  . albuterol (PROVENTIL HFA;VENTOLIN HFA) 108 (90 Base) MCG/ACT inhaler Inhale into the lungs.    . dicyclomine (BENTYL) 20 MG tablet Take 20 mg by mouth every 6 (six) hours.    Marland Kitchen esomeprazole (NEXIUM) 40 MG capsule Take by mouth.    . Naproxen Sodium (ALEVE) 220 MG CAPS Take 1-2 capsule by mouth three times a day as needed for pain.    Marland Kitchen zolpidem (AMBIEN) 10 MG tablet Take 1 tablet (10 mg total) by mouth once. 20 tablet 0   No current facility-administered  medications on file prior to visit.     Allergies  Allergen Reactions  . Metronidazole Other (See Comments)  . Clindamycin Hcl Rash  . Ketoconazole Rash    Social History   Social History  . Marital status: Married    Spouse name: N/A  . Number of children: N/A  . Years of education: N/A   Occupational History  . Not on file.   Social History Main Topics  . Smoking status: Never Smoker  . Smokeless tobacco: Never Used  . Alcohol use No  . Drug use: No  . Sexual activity: Yes    Partners: Male    Birth control/ protection: Surgical   Other Topics Concern  . Not on file   Social History Narrative  . No narrative on file    Family History  Problem Relation Age of Onset  . Heart attack Father   . Diabetes Father   . Diabetes Maternal Aunt   . Diabetes Maternal Uncle   . Diabetes Paternal Uncle   . Diabetes Maternal Grandmother   . Diabetes Paternal Grandmother   . Cancer Neg Hx   . Ovarian cancer Neg Hx     The  following portions of the patient's history were reviewed and updated as appropriate: allergies, current medications, past family history, past medical history, past social history, past surgical history and problem list.  Review of Systems ROS  Objective:   There were no vitals taken for this visit. CONSTITUTIONAL: Well-developed, well-nourished female in no acute distress.  PSYCHIATRIC: Normal mood and affect. Normal behavior. Normal judgment and thought content. [place]: Alert and oriented to person, place, and time. Normal muscle tone coordination. No cranial nerve deficit noted. HENT:  Normocephalic, atraumatic, External right and left ear normal. Oropharynx is clear and moist EYES: Conjunctivae and EOM are normal. Pupils are equal, round, and reactive to light. No scleral icterus.  NECK: Normal range of motion, supple, no masses.  Normal thyroid.  SKIN: Skin is warm and dry. No rash noted. Not diaphoretic. No erythema. No  pallor. CARDIOVASCULAR: Normal heart rate noted, regular rhythm, no murmur. RESPIRATORY: Clear to auscultation bilaterally. Effort and breath sounds normal, no problems with respiration noted. BREASTS: Symmetric in size. No masses, skin changes, nipple drainage, or lymphadenopathy. ABDOMEN: Soft, normal bowel sounds, no distention noted.  No tenderness, rebound or guarding.  BLADDER: Normal PELVIC:  External Genitalia: Normal  BUS: Normal  Vagina: Normal  Cervix: Surgically absent  Uterus: Surgically absent  Adnexa: Normal; mild right lower quadrant tenderness without palpable mass  RV: External Exam NormaI, No Rectal Masses and Normal Sphincter tone  MUSCULOSKELETAL: Normal range of motion. No tenderness.  No cyanosis, clubbing, or edema.  2+ distal pulses. LYMPHATIC: No Axillary, Supraclavicular, or Inguinal Adenopathy.    Assessment:   Annual gynecologic examination 51 y.o. Contraception: status post hysterectomy LAVH LSO and right salpingectomy Normal BMI Problem List Items Addressed This Visit    Well woman exam with routine gynecological exam - Primary   Endometriosis    Other Visit Diagnoses    Insomnia, unspecified type       Anxiety       Screening for breast cancer       Screening for colon cancer       Elevated triglycerides with high cholesterol          Plan:  Pap: Not needed Mammogram: Ordered Stool Guaiac Testing:  colonoscopy due age 18 Labs: vit d tsh a1c fbs lipid Routine preventative health maintenance measures emphasized: Exercise/Diet/Weight control, Tobacco Warnings, Alcohol/Substance use risks and Stress Management Return to [place], [place]   Note: This dictation was prepared with Dragon dictation along with smaller phrase technology. Any transcriptional errors that result from this process are unintentional.

## 2016-10-27 ENCOUNTER — Encounter: Payer: 59 | Admitting: Obstetrics and Gynecology

## 2016-10-27 ENCOUNTER — Ambulatory Visit (INDEPENDENT_AMBULATORY_CARE_PROVIDER_SITE_OTHER): Payer: BC Managed Care – PPO | Admitting: Obstetrics and Gynecology

## 2016-10-27 ENCOUNTER — Encounter: Payer: Self-pay | Admitting: Obstetrics and Gynecology

## 2016-10-27 VITALS — BP 91/63 | HR 105 | Ht 68.0 in | Wt 144.7 lb

## 2016-10-27 DIAGNOSIS — Z01419 Encounter for gynecological examination (general) (routine) without abnormal findings: Secondary | ICD-10-CM | POA: Diagnosis not present

## 2016-10-27 DIAGNOSIS — Z1239 Encounter for other screening for malignant neoplasm of breast: Secondary | ICD-10-CM

## 2016-10-27 DIAGNOSIS — Z1231 Encounter for screening mammogram for malignant neoplasm of breast: Secondary | ICD-10-CM | POA: Diagnosis not present

## 2016-10-27 DIAGNOSIS — N951 Menopausal and female climacteric states: Secondary | ICD-10-CM | POA: Diagnosis not present

## 2016-10-27 DIAGNOSIS — N952 Postmenopausal atrophic vaginitis: Secondary | ICD-10-CM | POA: Diagnosis not present

## 2016-10-27 DIAGNOSIS — Z1211 Encounter for screening for malignant neoplasm of colon: Secondary | ICD-10-CM

## 2016-10-27 MED ORDER — ZOLPIDEM TARTRATE 10 MG PO TABS: 10.0000 mg | ORAL_TABLET | Freq: Once | ORAL | 5 refills | Status: DC

## 2016-10-27 MED ORDER — ESTRADIOL 1 MG PO TABS: 1.0000 mg | ORAL_TABLET | Freq: Every day | ORAL | 12 refills | Status: DC

## 2016-10-27 NOTE — Patient Instructions (Signed)
1. No Pap smear needed 2. Mammogram ordered 3. Stool guaiac cards are given for colon cancer screening 4. Continue with healthy eating and exercise 5. Recommend calcium and vitamin D supplementation 6. Trial of estradiol 1 mg daily 7. Return in 8 weeks for follow-up 8. Return in 1 year for annual exam   Health Maintenance for Postmenopausal Women Menopause is a normal process in which your reproductive ability comes to an end. This process happens gradually over a span of months to years, usually between the ages of 17 and 13. Menopause is complete when you have missed 12 consecutive menstrual periods. It is important to talk with your health care provider about some of the most common conditions that affect postmenopausal women, such as heart disease, cancer, and bone loss (osteoporosis). Adopting a healthy lifestyle and getting preventive care can help to promote your health and wellness. Those actions can also lower your chances of developing some of these common conditions. What should I know about menopause? During menopause, you may experience a number of symptoms, such as:  Moderate-to-severe hot flashes.  Night sweats.  Decrease in sex drive.  Mood swings.  Headaches.  Tiredness.  Irritability.  Memory problems.  Insomnia.  Choosing to treat or not to treat menopausal changes is an individual decision that you make with your health care provider. What should I know about hormone replacement therapy and supplements? Hormone therapy products are effective for treating symptoms that are associated with menopause, such as hot flashes and night sweats. Hormone replacement carries certain risks, especially as you become older. If you are thinking about using estrogen or estrogen with progestin treatments, discuss the benefits and risks with your health care provider. What should I know about heart disease and stroke? Heart disease, heart attack, and stroke become more likely  as you age. This may be due, in part, to the hormonal changes that your body experiences during menopause. These can affect how your body processes dietary fats, triglycerides, and cholesterol. Heart attack and stroke are both medical emergencies. There are many things that you can do to help prevent heart disease and stroke:  Have your blood pressure checked at least every 1-2 years. High blood pressure causes heart disease and increases the risk of stroke.  If you are 14-69 years old, ask your health care provider if you should take aspirin to prevent a heart attack or a stroke.  Do not use any tobacco products, including cigarettes, chewing tobacco, or electronic cigarettes. If you need help quitting, ask your health care provider.  It is important to eat a healthy diet and maintain a healthy weight. ? Be sure to include plenty of vegetables, fruits, low-fat dairy products, and lean protein. ? Avoid eating foods that are high in solid fats, added sugars, or salt (sodium).  Get regular exercise. This is one of the most important things that you can do for your health. ? Try to exercise for at least 150 minutes each week. The type of exercise that you do should increase your heart rate and make you sweat. This is known as moderate-intensity exercise. ? Try to do strengthening exercises at least twice each week. Do these in addition to the moderate-intensity exercise.  Know your numbers.Ask your health care provider to check your cholesterol and your blood glucose. Continue to have your blood tested as directed by your health care provider.  What should I know about cancer screening? There are several types of cancer. Take the following steps to  reduce your risk and to catch any cancer development as early as possible. Breast Cancer  Practice breast self-awareness. ? This means understanding how your breasts normally appear and feel. ? It also means doing regular breast self-exams. Let your  health care provider know about any changes, no matter how small.  If you are 66 or older, have a clinician do a breast exam (clinical breast exam or CBE) every year. Depending on your age, family history, and medical history, it may be recommended that you also have a yearly breast X-ray (mammogram).  If you have a family history of breast cancer, talk with your health care provider about genetic screening.  If you are at high risk for breast cancer, talk with your health care provider about having an MRI and a mammogram every year.  Breast cancer (BRCA) gene test is recommended for women who have family members with BRCA-related cancers. Results of the assessment will determine the need for genetic counseling and BRCA1 and for BRCA2 testing. BRCA-related cancers include these types: ? Breast. This occurs in males or females. ? Ovarian. ? Tubal. This may also be called fallopian tube cancer. ? Cancer of the abdominal or pelvic lining (peritoneal cancer). ? Prostate. ? Pancreatic.  Cervical, Uterine, and Ovarian Cancer Your health care provider may recommend that you be screened regularly for cancer of the pelvic organs. These include your ovaries, uterus, and vagina. This screening involves a pelvic exam, which includes checking for microscopic changes to the surface of your cervix (Pap test).  For women ages 21-65, health care providers may recommend a pelvic exam and a Pap test every three years. For women ages 1-65, they may recommend the Pap test and pelvic exam, combined with testing for human papilloma virus (HPV), every five years. Some types of HPV increase your risk of cervical cancer. Testing for HPV may also be done on women of any age who have unclear Pap test results.  Other health care providers may not recommend any screening for nonpregnant women who are considered low risk for pelvic cancer and have no symptoms. Ask your health care provider if a screening pelvic exam is right  for you.  If you have had past treatment for cervical cancer or a condition that could lead to cancer, you need Pap tests and screening for cancer for at least 20 years after your treatment. If Pap tests have been discontinued for you, your risk factors (such as having a new sexual partner) need to be reassessed to determine if you should start having screenings again. Some women have medical problems that increase the chance of getting cervical cancer. In these cases, your health care provider may recommend that you have screening and Pap tests more often.  If you have a family history of uterine cancer or ovarian cancer, talk with your health care provider about genetic screening.  If you have vaginal bleeding after reaching menopause, tell your health care provider.  There are currently no reliable tests available to screen for ovarian cancer.  Lung Cancer Lung cancer screening is recommended for adults 30-69 years old who are at high risk for lung cancer because of a history of smoking. A yearly low-dose CT scan of the lungs is recommended if you:  Currently smoke.  Have a history of at least 30 pack-years of smoking and you currently smoke or have quit within the past 15 years. A pack-year is smoking an average of one pack of cigarettes per day for one year.  Yearly screening should:  Continue until it has been 15 years since you quit.  Stop if you develop a health problem that would prevent you from having lung cancer treatment.  Colorectal Cancer  This type of cancer can be detected and can often be prevented.  Routine colorectal cancer screening usually begins at age 87 and continues through age 61.  If you have risk factors for colon cancer, your health care provider may recommend that you be screened at an earlier age.  If you have a family history of colorectal cancer, talk with your health care provider about genetic screening.  Your health care provider may also  recommend using home test kits to check for hidden blood in your stool.  A small camera at the end of a tube can be used to examine your colon directly (sigmoidoscopy or colonoscopy). This is done to check for the earliest forms of colorectal cancer.  Direct examination of the colon should be repeated every 5-10 years until age 50. However, if early forms of precancerous polyps or small growths are found or if you have a family history or genetic risk for colorectal cancer, you may need to be screened more often.  Skin Cancer  Check your skin from head to toe regularly.  Monitor any moles. Be sure to tell your health care provider: ? About any new moles or changes in moles, especially if there is a change in a mole's shape or color. ? If you have a mole that is larger than the size of a pencil eraser.  If any of your family members has a history of skin cancer, especially at a young age, talk with your health care provider about genetic screening.  Always use sunscreen. Apply sunscreen liberally and repeatedly throughout the day.  Whenever you are outside, protect yourself by wearing long sleeves, pants, a wide-brimmed hat, and sunglasses.  What should I know about osteoporosis? Osteoporosis is a condition in which bone destruction happens more quickly than new bone creation. After menopause, you may be at an increased risk for osteoporosis. To help prevent osteoporosis or the bone fractures that can happen because of osteoporosis, the following is recommended:  If you are 78-40 years old, get at least 1,000 mg of calcium and at least 600 mg of vitamin D per day.  If you are older than age 73 but younger than age 18, get at least 1,200 mg of calcium and at least 600 mg of vitamin D per day.  If you are older than age 74, get at least 1,200 mg of calcium and at least 800 mg of vitamin D per day.  Smoking and excessive alcohol intake increase the risk of osteoporosis. Eat foods that are  rich in calcium and vitamin D, and do weight-bearing exercises several times each week as directed by your health care provider. What should I know about how menopause affects my mental health? Depression may occur at any age, but it is more common as you become older. Common symptoms of depression include:  Low or sad mood.  Changes in sleep patterns.  Changes in appetite or eating patterns.  Feeling an overall lack of motivation or enjoyment of activities that you previously enjoyed.  Frequent crying spells.  Talk with your health care provider if you think that you are experiencing depression. What should I know about immunizations? It is important that you get and maintain your immunizations. These include:  Tetanus, diphtheria, and pertussis (Tdap) booster vaccine.  Influenza  every year before the flu season begins.  Pneumonia vaccine.  Shingles vaccine.  Your health care provider may also recommend other immunizations. This information is not intended to replace advice given to you by your health care provider. Make sure you discuss any questions you have with your health care provider. Document Released: 03/26/2005 Document Revised: 08/22/2015 Document Reviewed: 11/05/2014 Elsevier Interactive Patient Education  2018 [employer]  

## 2016-10-27 NOTE — Progress Notes (Signed)
ANNUAL PREVENTATIVE CARE GYN  ENCOUNTER NOTE  Subjective:       Kimberly Cantrell is a 51 y.o. married white female, para 3003, with history of fibroids and endometriosis, status post LAVH LSO and right salpingectomy, along with excisional biopsies of endometriosis, presents for routine annual gynecologic exam.  Current complaints: 1. Refill ambien; Uses medication intermittently; has early morning awakening.  2. Rt side pelvic pain  Several months ago; no current pain at this time 3. Vasomotor symptoms started several months ago, mild to moderate  Bowel and bladder function are normal.  Gynecologic History No LMP recorded. Patient has had a hysterectomy. LAVH LSO, right salpingectomy Contraception: status post hysterectomy Last Pap: 2012. Results were: normal Last mammogram: 01/2015 birad 1 Results were: normal History of endometriosis  Obstetric History OB History  Gravida Para Term Preterm AB Living  3 3 3     3   SAB TAB Ectopic Multiple Live Births          3    # Outcome Date GA Lbr Len/2nd Weight Sex Delivery Anes PTL Lv  3 Term 2003    F Vag-Spont   LIV  2 Term 2000    M Vag-Spont   LIV  1 Term 1998    F Vag-Spont   LIV    Para 3003  Past Medical History:  Diagnosis Date  . Bone lesion 02/07/2015  . DDD (degenerative disc disease), cervical   . DJD (degenerative joint disease) of cervical spine   . Endometriosis   . Vitamin D deficiency     Past Surgical History:  Procedure Laterality Date  . ABDOMINAL HYSTERECTOMY    . CHOLECYSTECTOMY  2007  . HERNIA REPAIR     age 51  . NASAL SINUS SURGERY     age 47  . OOPHORECTOMY    . PARTIAL HYSTERECTOMY     1 ovary remaining    Current Outpatient Prescriptions on File Prior to Visit  Medication Sig Dispense Refill  . Naproxen Sodium (ALEVE) 220 MG CAPS Take 1-2 capsule by mouth three times a day as needed for pain.    Marland Kitchen zolpidem (AMBIEN) 10 MG tablet Take 1 tablet (10 mg total) by mouth once. 20 tablet 0   No  current facility-administered medications on file prior to visit.     Allergies  Allergen Reactions  . Metronidazole Other (See Comments)  . Clindamycin Hcl Rash  . Ketoconazole Rash    Social History   Social History  . Marital status: Married    Spouse name: N/A  . Number of children: N/A  . Years of education: N/A   Occupational History  . Not on file.   Social History Main Topics  . Smoking status: Never Smoker  . Smokeless tobacco: Never Used  . Alcohol use No  . Drug use: No  . Sexual activity: Yes    Partners: Male    Birth control/ protection: Surgical   Other Topics Concern  . Not on file   Social History Narrative  . No narrative on file    Family History  Problem Relation Age of Onset  . Heart attack Father   . Diabetes Father   . Diabetes Maternal Aunt   . Diabetes Maternal Uncle   . Diabetes Paternal Uncle   . Diabetes Maternal Grandmother   . Diabetes Paternal Grandmother   . Cancer Neg Hx   . Ovarian cancer Neg Hx     The following portions of the patient's  history were reviewed and updated as appropriate: allergies, current medications, past family history, past medical history, past social history, past surgical history and problem list.  Review of Systems Review of Systems  Constitutional:       Hot flashes and night sweats that started several months ago  HENT: Negative.   Eyes: Negative.   Respiratory: Negative.   Cardiovascular: Negative.   Gastrointestinal: Negative.   Genitourinary: Negative.   Musculoskeletal: Negative.   Skin: Negative.   Neurological: Negative.   Endo/Heme/Allergies: Negative.   Psychiatric/Behavioral: Negative.     Objective:   BP 91/63   Pulse (!) 105   Ht 5\' 8"  (1.727 m)   Wt 144 lb 11.2 oz (65.6 kg)   BMI 22.00 kg/m  CONSTITUTIONAL: Well-developed, well-nourished female in no acute distress.  PSYCHIATRIC: Normal mood and affect. Normal behavior. Normal judgment and thought content. [place]:  Alert and oriented to person, place, and time. Normal muscle tone coordination. No cranial nerve deficit noted. HENT:  Normocephalic, atraumatic, External right and left ear normal. Oropharynx is clear and moist EYES: Conjunctivae and EOM are normal. Pupils are equal, round, and reactive to light. No scleral icterus.  NECK: Normal range of motion, supple, no masses.  Normal thyroid.  SKIN: Skin is warm and dry. No rash noted. Not diaphoretic. No erythema. No pallor. CARDIOVASCULAR: Normal heart rate noted, regular rhythm, no murmur. RESPIRATORY: Clear to auscultation bilaterally. Effort and breath sounds normal, no problems with respiration noted. BREASTS: Symmetric in size. No masses, skin changes, nipple drainage, or lymphadenopathy. ABDOMEN: Soft, normal bowel sounds, no distention noted.  No tenderness, rebound or guarding.  BLADDER: Normal PELVIC:  External Genitalia: Normal  BUS: Normal  Vagina: Mild atrophy  Cervix: Surgically absent  Uterus: Surgically absent  Adnexa: Normal; mild right lower quadrant tenderness without palpable mass  RV: External Exam NormaI, No Rectal Masses and Normal Sphincter tone  MUSCULOSKELETAL: Normal range of motion. No tenderness.  No cyanosis, clubbing, or edema.  2+ distal pulses. LYMPHATIC: No Axillary, Supraclavicular, or Inguinal Adenopathy.    Assessment:   Annual gynecologic examination 51 y.o. Contraception: status post hysterectomy LAVH LSO and right salpingectomy Normal BMI Vasomotor symptoms Vaginal atrophy  Plan:  Pap: Not needed Mammogram: Ordered Stool Guaiac Testing:  Ordered  Labs: vit d tsh a1c fbs lipid Routine preventative health maintenance measures emphasized: Exercise/Diet/Weight control, Tobacco Warnings, Alcohol/Substance use risks and Stress Management  Trial of estradiol 1 mg daily Return in 8 weeks for follow-up Return to [place], CMA  Brayton Mars, MD   Note: This dictation  was prepared with Dragon dictation along with smaller phrase technology. Any transcriptional errors that result from this process are unintentional.

## 2016-10-28 ENCOUNTER — Telehealth: Payer: Self-pay

## 2016-10-28 LAB — GLUCOSE, RANDOM: Glucose: 78 mg/dL (ref 65–99)

## 2016-10-28 LAB — LIPID PANEL
CHOL/HDL RATIO: 4.8 ratio — AB (ref 0.0–4.4)
Cholesterol, Total: 247 mg/dL — ABNORMAL HIGH (ref 100–199)
HDL: 51 mg/dL (ref >39)
LDL CALC: 147 mg/dL — AB (ref 0–99)
TRIGLYCERIDES: 243 mg/dL — AB (ref 0–149)
VLDL Cholesterol Cal: 49 mg/dL — ABNORMAL HIGH (ref 5–40)

## 2016-10-28 LAB — HEMOGLOBIN A1C
Est. average glucose Bld gHb Est-mCnc: 94 mg/dL
Hgb A1c MFr Bld: 4.9 % (ref 4.8–5.6)

## 2016-10-28 LAB — TSH: TSH: 2.62 u[IU]/mL (ref 0.450–4.500)

## 2016-10-28 NOTE — Telephone Encounter (Signed)
error 

## 2017-01-28 ENCOUNTER — Encounter: Payer: Self-pay | Admitting: Certified Nurse Midwife

## 2017-01-28 ENCOUNTER — Ambulatory Visit: Payer: BC Managed Care – PPO | Admitting: Certified Nurse Midwife

## 2017-01-28 VITALS — BP 105/57 | HR 84 | Temp 98.8°F | Ht 68.0 in | Wt 148.8 lb

## 2017-01-28 DIAGNOSIS — R300 Dysuria: Secondary | ICD-10-CM

## 2017-01-28 LAB — POCT URINALYSIS DIPSTICK
Bilirubin, UA: NEGATIVE
Blood, UA: NEGATIVE
Glucose, UA: NEGATIVE
KETONES UA: NEGATIVE
Leukocytes, UA: NEGATIVE
NITRITE UA: NEGATIVE
Odor: NEGATIVE
PROTEIN UA: NEGATIVE
UROBILINOGEN UA: 0.2 U/dL
pH, UA: 6 (ref 5.0–8.0)

## 2017-01-28 MED ORDER — NITROFURANTOIN MONOHYD MACRO 100 MG PO CAPS: 100.0000 mg | ORAL_CAPSULE | Freq: Two times a day (BID) | ORAL | 0 refills | Status: DC

## 2017-01-28 NOTE — Patient Instructions (Signed)

## 2017-01-28 NOTE — Progress Notes (Signed)
GYN ENCOUNTER NOTE  Subjective:       Kimberly Cantrell is a 51 y.o. G65P3003 female is here for gynecologic evaluation of the following issues:  1. Urinary urgency and frequency x 5 days. She denies fever, burning or blood in her urine. She has tried cranberry juice without relief. She states that she feels achy pain.     Obstetric History OB History  Gravida Para Term Preterm AB Living  3 3 3     3   SAB TAB Ectopic Multiple Live Births          3    # Outcome Date GA Lbr Len/2nd Weight Sex Delivery Anes PTL Lv  3 Term 2003    F Vag-Spont   LIV  2 Term 2000    M Vag-Spont   LIV  1 Term 1998    F Vag-Spont   LIV      Past Medical History:  Diagnosis Date  . Bone lesion 02/07/2015  . DDD (degenerative disc disease), cervical   . DJD (degenerative joint disease) of cervical spine   . Endometriosis   . Vitamin D deficiency     Past Surgical History:  Procedure Laterality Date  . ABDOMINAL HYSTERECTOMY    . CHOLECYSTECTOMY  2007  . HERNIA REPAIR     age 78  . NASAL SINUS SURGERY     age 38  . OOPHORECTOMY    . PARTIAL HYSTERECTOMY     1 ovary remaining    Current Outpatient Medications on File Prior to Visit  Medication Sig Dispense Refill  . Naproxen Sodium (ALEVE) 220 MG CAPS Take 1-2 capsule by mouth three times a day as needed for pain.    Marland Kitchen zolpidem (AMBIEN) 10 MG tablet Take 1 tablet (10 mg total) by mouth once. 30 tablet 5   No current facility-administered medications on file prior to visit.     Allergies  Allergen Reactions  . Metronidazole Other (See Comments)  . Clindamycin Hcl Rash  . Ketoconazole Rash    Social History   Socioeconomic History  . Marital status: Married    Spouse name: Not on file  . Number of children: Not on file  . Years of education: Not on file  . Highest education level: Not on file  Social Needs  . Financial resource strain: Not on file  . Food insecurity - worry: Not on file  . Food insecurity - inability: Not on file   . Transportation needs - medical: Not on file  . Transportation needs - non-medical: Not on file  Occupational History  . Not on file  Tobacco Use  . Smoking status: Never Smoker  . Smokeless tobacco: Never Used  Substance and Sexual Activity  . Alcohol use: No    Alcohol/week: 0.0 oz  . Drug use: No  . Sexual activity: Yes    Partners: Male    Birth control/protection: Surgical  Other Topics Concern  . Not on file  Social History Narrative  . Not on file    Family History  Problem Relation Age of Onset  . Heart attack Father   . Diabetes Father   . Diabetes Maternal Aunt   . Diabetes Maternal Uncle   . Diabetes Paternal Uncle   . Diabetes Maternal Grandmother   . Diabetes Paternal Grandmother   . Cancer Neg Hx   . Ovarian cancer Neg Hx     The following portions of the patient's history were reviewed and updated as appropriate:  allergies, current medications, past family history, past medical history, past social history, past surgical history and problem list.  Review of Systems Review of Systems - Negative except as mentioned in HPI Review of Systems - General ROS: negative for - chills, fatigue, fever, hot flashes, malaise or night sweats Positive for increase in sweating, dry skin  Hematological and Lymphatic ROS: negative for - bleeding problems or swollen lymph nodes Gastrointestinal ROS: negative for - abdominal pain, blood in stools, change in bowel habits and nausea/vomiting Musculoskeletal ROS: negative for - joint pain, muscle pain or muscular weakness Genito-Urinary ROS: negative for - change in menstrual cycle, dysmenorrhea, dyspareunia, dysuria, genital discharge, genital ulcers, hematuria, incontinence, irregular/heavy menses, nocturia or pelvic pain  Objective:   BP (!) 105/57   Pulse 84   Temp 98.8 F (37.1 C)   Ht 5\' 8"  (1.727 m)   Wt 148 lb 12.8 oz (67.5 kg)   BMI 22.62 kg/m  CONSTITUTIONAL: Well-developed, well-nourished female in no acute  distress.  HENT:  Normocephalic, atraumatic.  NECK: Normal range of motion, supple, no masses.  Normal thyroid.  SKIN: Skin is warm and dry. No rash noted. Not diaphoretic. No erythema. No pallor. [place]: Alert and oriented to person, place, and time. PSYCHIATRIC: Normal mood and affect. Normal behavior. Normal judgment and thought content. CARDIOVASCULAR:Not Examined RESPIRATORY: Clear, no signs of respiratory destress BREASTS: Not Examined Kidney: negative CVA tenderness ABDOMEN: Soft, non distended; Non tender.  No Organomegaly. PELVIC:Not indicated    MUSCULOSKELETAL: Normal range of motion. No tenderness.  No cyanosis, clubbing, or edema.   Assessment:   1. Dysuria - POCT urinalysis dipstick - Urine Culture   Plan:   Samples of uribel given, Macrobid ordered. Will follow up with culture results. Follow up with Dr. Enzo Bi if symptoms fail to improve or worsen.   Philip Aspen, CNM

## 2017-01-30 LAB — URINE CULTURE: ORGANISM ID, BACTERIA: NO GROWTH

## 2017-05-17 ENCOUNTER — Other Ambulatory Visit: Payer: Self-pay | Admitting: Obstetrics and Gynecology

## 2017-09-07 ENCOUNTER — Telehealth: Payer: Self-pay | Admitting: Obstetrics and Gynecology

## 2017-09-07 MED ORDER — ZOLPIDEM TARTRATE 10 MG PO TABS: ORAL_TABLET | ORAL | 0 refills | Status: DC

## 2017-09-07 NOTE — Telephone Encounter (Signed)
Patient came in requesting a refill on Ambien. Thanks

## 2017-09-07 NOTE — Addendum Note (Signed)
Addended by: Edwyna Shell on: 09/07/2017 04:54 PM   Modules accepted: Orders

## 2017-09-08 NOTE — Telephone Encounter (Signed)
Pt called and informed that her Ambien was refilled and being faxed to the pharmacy.

## 2017-11-15 ENCOUNTER — Other Ambulatory Visit: Payer: Self-pay | Admitting: Obstetrics and Gynecology

## 2018-01-25 ENCOUNTER — Encounter: Payer: Self-pay | Admitting: Obstetrics and Gynecology

## 2018-01-25 ENCOUNTER — Ambulatory Visit (INDEPENDENT_AMBULATORY_CARE_PROVIDER_SITE_OTHER): Payer: BC Managed Care – PPO | Admitting: Obstetrics and Gynecology

## 2018-01-25 VITALS — BP 132/84 | Ht 67.0 in | Wt 153.2 lb

## 2018-01-25 DIAGNOSIS — N951 Menopausal and female climacteric states: Secondary | ICD-10-CM

## 2018-01-25 DIAGNOSIS — Z1239 Encounter for other screening for malignant neoplasm of breast: Secondary | ICD-10-CM | POA: Diagnosis not present

## 2018-01-25 DIAGNOSIS — Z01419 Encounter for gynecological examination (general) (routine) without abnormal findings: Secondary | ICD-10-CM

## 2018-01-25 DIAGNOSIS — N952 Postmenopausal atrophic vaginitis: Secondary | ICD-10-CM

## 2018-01-25 DIAGNOSIS — N809 Endometriosis, unspecified: Secondary | ICD-10-CM

## 2018-01-25 DIAGNOSIS — Z87898 Personal history of other specified conditions: Secondary | ICD-10-CM

## 2018-01-25 DIAGNOSIS — Z86018 Personal history of other benign neoplasm: Secondary | ICD-10-CM

## 2018-01-25 DIAGNOSIS — M899 Disorder of bone, unspecified: Secondary | ICD-10-CM

## 2018-01-25 MED ORDER — ZOLPIDEM TARTRATE 10 MG PO TABS: ORAL_TABLET | ORAL | 5 refills | Status: DC

## 2018-01-25 NOTE — Patient Instructions (Addendum)
1.  Pap smear is not done.  No further Pap smears are needed. 2.  Mammogram is ordered. 3.  Screening labs are ordered, including FSH and vitamin D level 4.  Trial of Premarin cream 1/2 g intravaginally twice weekly 5.  Continue with healthy eating and exercise. 6.  Recommend calcium 600 mg twice a day and vitamin D 400 international units twice a day 7.  Neurosurgery consultation is ordered due to history of T10-T12 spine hemangioma and worsening low back pain and hip pain 8.  Return in 1 year for annual exam to see Dr. Marcelline Cantrell   Health Maintenance for Postmenopausal Women Menopause is a normal process in which your reproductive ability comes to an end. This process happens gradually over a span of months to years, usually between the ages of 50 and 12. Menopause is complete when you have missed 12 consecutive menstrual periods. It is important to talk with your health care provider about some of the most common conditions that affect postmenopausal women, such as heart disease, cancer, and bone loss (osteoporosis). Adopting a healthy lifestyle and getting preventive care can help to promote your health and wellness. Those actions can also lower your chances of developing some of these common conditions. What should I know about menopause? During menopause, you may experience a number of symptoms, such as:  Moderate-to-severe hot flashes.  Night sweats.  Decrease in sex drive.  Mood swings.  Headaches.  Tiredness.  Irritability.  Memory problems.  Insomnia.  Choosing to treat or not to treat menopausal changes is an individual decision that you make with your health care provider. What should I know about hormone replacement therapy and supplements? Hormone therapy products are effective for treating symptoms that are associated with menopause, such as hot flashes and night sweats. Hormone replacement carries certain risks, especially as you become older. If you are thinking about  using estrogen or estrogen with progestin treatments, discuss the benefits and risks with your health care provider. What should I know about heart disease and stroke? Heart disease, heart attack, and stroke become more likely as you age. This may be due, in part, to the hormonal changes that your body experiences during menopause. These can affect how your body processes dietary fats, triglycerides, and cholesterol. Heart attack and stroke are both medical emergencies. There are many things that you can do to help prevent heart disease and stroke:  Have your blood pressure checked at least every 1-2 years. High blood pressure causes heart disease and increases the risk of stroke.  If you are 48-64 years old, ask your health care provider if you should take aspirin to prevent a heart attack or a stroke.  Do not use any tobacco products, including cigarettes, chewing tobacco, or electronic cigarettes. If you need help quitting, ask your health care provider.  It is important to eat a healthy diet and maintain a healthy weight. ? Be sure to include plenty of vegetables, fruits, low-fat dairy products, and lean protein. ? Avoid eating foods that are high in solid fats, added sugars, or salt (sodium).  Get regular exercise. This is one of the most important things that you can do for your health. ? Try to exercise for at least 150 minutes each week. The type of exercise that you do should increase your heart rate and make you sweat. This is known as moderate-intensity exercise. ? Try to do strengthening exercises at least twice each week. Do these in addition to the moderate-intensity exercise.  Know your numbers.Ask your health care provider to check your cholesterol and your blood glucose. Continue to have your blood tested as directed by your health care provider.  What should I know about cancer screening? There are several types of cancer. Take the following steps to reduce your risk and to  catch any cancer development as early as possible. Breast Cancer  Practice breast self-awareness. ? This means understanding how your breasts normally appear and feel. ? It also means doing regular breast self-exams. Let your health care provider know about any changes, no matter how small.  If you are 57 or older, have a clinician do a breast exam (clinical breast exam or CBE) every year. Depending on your age, family history, and medical history, it may be recommended that you also have a yearly breast X-ray (mammogram).  If you have a family history of breast cancer, talk with your health care provider about genetic screening.  If you are at high risk for breast cancer, talk with your health care provider about having an MRI and a mammogram every year.  Breast cancer (BRCA) gene test is recommended for women who have family members with BRCA-related cancers. Results of the assessment will determine the need for genetic counseling and BRCA1 and for BRCA2 testing. BRCA-related cancers include these types: ? Breast. This occurs in males or females. ? Ovarian. ? Tubal. This may also be called fallopian tube cancer. ? Cancer of the abdominal or pelvic lining (peritoneal cancer). ? Prostate. ? Pancreatic.  Cervical, Uterine, and Ovarian Cancer Your health care provider may recommend that you be screened regularly for cancer of the pelvic organs. These include your ovaries, uterus, and vagina. This screening involves a pelvic exam, which includes checking for microscopic changes to the surface of your cervix (Pap test).  For women ages 21-65, health care providers may recommend a pelvic exam and a Pap test every three years. For women ages 18-65, they may recommend the Pap test and pelvic exam, combined with testing for human papilloma virus (HPV), every five years. Some types of HPV increase your risk of cervical cancer. Testing for HPV may also be done on women of any age who have unclear Pap  test results.  Other health care providers may not recommend any screening for nonpregnant women who are considered low risk for pelvic cancer and have no symptoms. Ask your health care provider if a screening pelvic exam is right for you.  If you have had past treatment for cervical cancer or a condition that could lead to cancer, you need Pap tests and screening for cancer for at least 20 years after your treatment. If Pap tests have been discontinued for you, your risk factors (such as having a new sexual partner) need to be reassessed to determine if you should start having screenings again. Some women have medical problems that increase the chance of getting cervical cancer. In these cases, your health care provider may recommend that you have screening and Pap tests more often.  If you have a family history of uterine cancer or ovarian cancer, talk with your health care provider about genetic screening.  If you have vaginal bleeding after reaching menopause, tell your health care provider.  There are currently no reliable tests available to screen for ovarian cancer.  Lung Cancer Lung cancer screening is recommended for adults 20-37 years old who are at high risk for lung cancer because of a history of smoking. A yearly low-dose CT scan of the  lungs is recommended if you:  Currently smoke.  Have a history of at least 30 pack-years of smoking and you currently smoke or have quit within the past 15 years. A pack-year is smoking an average of one pack of cigarettes per day for one year.  Yearly screening should:  Continue until it has been 15 years since you quit.  Stop if you develop a health problem that would prevent you from having lung cancer treatment.  Colorectal Cancer  This type of cancer can be detected and can often be prevented.  Routine colorectal cancer screening usually begins at age 60 and continues through age 100.  If you have risk factors for colon cancer, your  health care provider may recommend that you be screened at an earlier age.  If you have a family history of colorectal cancer, talk with your health care provider about genetic screening.  Your health care provider may also recommend using home test kits to check for hidden blood in your stool.  A small camera at the end of a tube can be used to examine your colon directly (sigmoidoscopy or colonoscopy). This is done to check for the earliest forms of colorectal cancer.  Direct examination of the colon should be repeated every 5-10 years until age 76. However, if early forms of precancerous polyps or small growths are found or if you have a family history or genetic risk for colorectal cancer, you may need to be screened more often.  Skin Cancer  Check your skin from head to toe regularly.  Monitor any moles. Be sure to tell your health care provider: ? About any new moles or changes in moles, especially if there is a change in a mole's shape or color. ? If you have a mole that is larger than the size of a pencil eraser.  If any of your family members has a history of skin cancer, especially at a young age, talk with your health care provider about genetic screening.  Always use sunscreen. Apply sunscreen liberally and repeatedly throughout the day.  Whenever you are outside, protect yourself by wearing long sleeves, pants, a wide-brimmed hat, and sunglasses.  What should I know about osteoporosis? Osteoporosis is a condition in which bone destruction happens more quickly than new bone creation. After menopause, you may be at an increased risk for osteoporosis. To help prevent osteoporosis or the bone fractures that can happen because of osteoporosis, the following is recommended:  If you are 38-9 years old, get at least 1,000 mg of calcium and at least 600 mg of vitamin D per day.  If you are older than age 57 but younger than age 48, get at least 1,200 mg of calcium and at least 600  mg of vitamin D per day.  If you are older than age 94, get at least 1,200 mg of calcium and at least 800 mg of vitamin D per day.  Smoking and excessive alcohol intake increase the risk of osteoporosis. Eat foods that are rich in calcium and vitamin D, and do weight-bearing exercises several times each week as directed by your health care provider. What should I know about how menopause affects my mental health? Depression may occur at any age, but it is more common as you become older. Common symptoms of depression include:  Low or sad mood.  Changes in sleep patterns.  Changes in appetite or eating patterns.  Feeling an overall lack of motivation or enjoyment of activities that you previously  enjoyed.  Frequent crying spells.  Talk with your health care provider if you think that you are experiencing depression. What should I know about immunizations? It is important that you get and maintain your immunizations. These include:  Tetanus, diphtheria, and pertussis (Tdap) booster vaccine.  Influenza every year before the flu season begins.  Pneumonia vaccine.  Shingles vaccine.  Your health care provider may also recommend other immunizations. This information is not intended to replace advice given to you by your health care provider. Make sure you discuss any questions you have with your health care provider. Document Released: 03/26/2005 Document Revised: 08/22/2015 Document Reviewed: 11/05/2014 Elsevier Interactive Patient Education  2018 [employer].

## 2018-01-25 NOTE — Progress Notes (Signed)
ANNUAL PREVENTATIVE CARE GYN  ENCOUNTER NOTE  Subjective:       Kimberly Cantrell is a 52 y.o. married white female, para 3003, with history of fibroids and endometriosis, status post LAVH LSO and right salpingectomy, along with excisional biopsies of endometriosis, presents for routine annual gynecologic exam.  Current complaints:  1. Patient is having lower back pain; history of  spine T10-T12 hemangioma worked up 1 year ago; over the past month has noted increasing low back pain and bilateral hip pain.  Patient has been doing a lot of work for house prep prior to sale.  Significant lifting was performed.  Patient is never followed up regarding the thoracic spine hemangioma.  2.  Patient is minimally symptomatic with vasomotor symptoms; she never started the estradiol which was prescribed 1 year ago.  She does not report significant vaginal atrophy symptoms, but does report some urinary urgency and frequency which is chronic in nature.  Bowel function is normal.    Gynecologic History No LMP recorded. Patient has had a hysterectomy. LAVH LSO, right salpingectomy Contraception: status post hysterectomy Last Pap: 2012. Results were: normal Last mammogram: 01/2015 birad 1 Results were: normal History of endometriosis  Obstetric History OB History  Gravida Para Term Preterm AB Living  3 3 3     3   SAB TAB Ectopic Multiple Live Births          3    # Outcome Date GA Lbr Len/2nd Weight Sex Delivery Anes PTL Lv  3 Term 2003    F Vag-Spont   LIV  2 Term 2000    M Vag-Spont   LIV  1 Term 1998    F Vag-Spont   LIV  Para 3003  Past Medical History:  Diagnosis Date  . Bone lesion 02/07/2015  . DDD (degenerative disc disease), cervical   . DJD (degenerative joint disease) of cervical spine   . Endometriosis   . Vitamin D deficiency     Past Surgical History:  Procedure Laterality Date  . ABDOMINAL HYSTERECTOMY    . CHOLECYSTECTOMY  2007  . HERNIA REPAIR     age 61  . NASAL SINUS  SURGERY     age 52  . OOPHORECTOMY    . PARTIAL HYSTERECTOMY     1 ovary remaining    Current Outpatient Medications on File Prior to Visit  Medication Sig Dispense Refill  . Naproxen Sodium (ALEVE) 220 MG CAPS Take 1-2 capsule by mouth three times a day as needed for pain.    . nitrofurantoin, macrocrystal-monohydrate, (MACROBID) 100 MG capsule Take 1 capsule (100 mg total) by mouth 2 (two) times daily. 14 capsule 0  . zolpidem (AMBIEN) 10 MG tablet TAKE 1 TABLET BY MOUTH EVERY DAY 30 tablet 0   No current facility-administered medications on file prior to visit.     Allergies  Allergen Reactions  . Metronidazole Other (See Comments)  . Clindamycin Hcl Rash  . Ketoconazole Rash    Social History   Socioeconomic History  . Marital status: Married    Spouse name: Not on file  . Number of children: Not on file  . Years of education: Not on file  . Highest education level: Not on file  Occupational History  . Not on file  Social Needs  . Financial resource strain: Not on file  . Food insecurity:    Worry: Not on file    Inability: Not on file  . Transportation needs:    Medical:  Not on file    Non-medical: Not on file  Tobacco Use  . Smoking status: Never Smoker  . Smokeless tobacco: Never Used  Substance and Sexual Activity  . Alcohol use: No    Alcohol/week: 0.0 standard drinks  . Drug use: No  . Sexual activity: Yes    Partners: Male    Birth control/protection: Surgical  Lifestyle  . Physical activity:    Days per week: Not on file    Minutes per session: Not on file  . Stress: Not on file  Relationships  . Social connections:    Talks on phone: Not on file    Gets together: Not on file    Attends religious service: Not on file    Active member of club or organization: Not on file    Attends meetings of clubs or organizations: Not on file    Relationship status: Not on file  . Intimate partner violence:    Fear of current or ex partner: Not on file     Emotionally abused: Not on file    Physically abused: Not on file    Forced sexual activity: Not on file  Other Topics Concern  . Not on file  Social History Narrative  . Not on file    Family History  Problem Relation Age of Onset  . Heart attack Father   . Diabetes Father   . Diabetes Maternal Aunt   . Diabetes Maternal Uncle   . Diabetes Paternal Uncle   . Diabetes Maternal Grandmother   . Diabetes Paternal Grandmother   . Cancer Neg Hx   . Ovarian cancer Neg Hx     The following portions of the patient's history were reviewed and updated as appropriate: allergies, current medications, past family history, past medical history, past social history, past surgical history and problem list.  Review of Systems Review of Systems  Constitutional: Negative for diaphoresis and malaise/fatigue.  Respiratory: Negative.   Cardiovascular: Negative.   Gastrointestinal: Negative.   Genitourinary: Negative.   Musculoskeletal: Positive for back pain and joint pain.       Hip pain, bilateral  Skin: Negative.   Neurological: Negative.   All other systems reviewed and are negative.    Objective:   BP 132/84   Ht 5\' 7"  (1.702 m)   Wt 153 lb 3.2 oz (69.5 kg)   BMI 23.99 kg/m  CONSTITUTIONAL: Well-developed, well-nourished female in no acute distress.  PSYCHIATRIC: Normal mood and affect. Normal behavior. Normal judgment and thought content. [place]: Alert and oriented to person, place, and time. Normal muscle tone coordination. No cranial nerve deficit noted. HENT:  Normocephalic, atraumatic, External right and left ear normal.  EYES: Conjunctivae and EOM are normal.No scleral icterus.  NECK: Normal range of motion, supple, no masses.  Normal thyroid.  SKIN: Skin is warm and dry. No rash noted. Not diaphoretic. No erythema. No pallor. CARDIOVASCULAR: Normal heart rate noted, regular rhythm, no murmur. RESPIRATORY: Clear to auscultation bilaterally. Effort and breath sounds  normal, no problems with respiration noted. BREASTS: Symmetric in size. No masses, skin changes, nipple drainage, or lymphadenopathy. ABDOMEN: Soft, normal bowel sounds, no distention noted.  No tenderness, rebound or guarding.  BLADDER: Normal; nontender PELVIC:  External Genitalia: Normal  BUS: Normal  Vagina: Mild to moderate atrophy; good support  Cervix: Surgically absent  Uterus: Surgically absent  Adnexa: Normal; mild right lower quadrant tenderness without palpable mass  RV: External Exam NormaI, No Rectal Masses and Normal Sphincter tone  MUSCULOSKELETAL: Normal range of motion. No tenderness.  No cyanosis, clubbing, or edema.  2+ distal pulses. LYMPHATIC: No Axillary, Supraclavicular, or Inguinal Adenopathy.    Assessment:   Annual gynecologic examination 52 y.o. Contraception: status post hysterectomy LAVH LSO and right salpingectomy Normal BMI Vasomotor symptoms, minimal, not desiring ERT Vaginal atrophy, mild to moderate Back pain and bilateral hip pain; history of spine hemangioma at T10-T12 without follow-up  Plan:  Pap: Not needed Mammogram: Ordered Stool Guaiac Testing:  Ordered  Labs: vit d tsh a1c fbs lipid Routine preventative health maintenance measures emphasized: Exercise/Diet/Weight control, Tobacco Warnings, Alcohol/Substance use risks and Stress Management  Trial of treatment cream 1/2 g intravaginal twice weekly Referral to neurosurgery for evaluation of back pain, hip pain, and spine hemangioma Return to [place], CMA   Shaune Pascal CMA acting as scribe for Dr. Enzo Bi. I have reviewed, updated, and concur with information scribed. Brayton Mars, MD  Note: This dictation was prepared with Dragon dictation along with smaller phrase technology. Any transcriptional errors that result from this process are unintentional.

## 2018-01-26 LAB — LIPID PANEL
CHOL/HDL RATIO: 5.3 ratio — AB (ref 0.0–4.4)
Cholesterol, Total: 238 mg/dL — ABNORMAL HIGH (ref 100–199)
HDL: 45 mg/dL (ref >39)
LDL CALC: 114 mg/dL — AB (ref 0–99)
Triglycerides: 394 mg/dL — ABNORMAL HIGH (ref 0–149)
VLDL CHOLESTEROL CAL: 79 mg/dL — AB (ref 5–40)

## 2018-01-26 LAB — HEMOGLOBIN A1C
Est. average glucose Bld gHb Est-mCnc: 103 mg/dL
HEMOGLOBIN A1C: 5.2 % (ref 4.8–5.6)

## 2018-01-26 LAB — VITAMIN D 25 HYDROXY (VIT D DEFICIENCY, FRACTURES): Vit D, 25-Hydroxy: 7.4 ng/mL — ABNORMAL LOW (ref 30.0–100.0)

## 2018-01-26 LAB — TSH: TSH: 2.78 u[IU]/mL (ref 0.450–4.500)

## 2018-01-26 LAB — GLUCOSE, RANDOM: GLUCOSE: 74 mg/dL (ref 65–99)

## 2018-01-26 LAB — FOLLICLE STIMULATING HORMONE: FSH: 82 m[IU]/mL

## 2018-03-03 ENCOUNTER — Ambulatory Visit: Payer: BC Managed Care – PPO | Admitting: Family Medicine

## 2018-03-03 ENCOUNTER — Encounter: Payer: Self-pay | Admitting: Family Medicine

## 2018-03-03 VITALS — BP 110/70 | HR 98 | Temp 98.1°F | Resp 16 | Ht 67.5 in | Wt 153.4 lb

## 2018-03-03 DIAGNOSIS — H538 Other visual disturbances: Secondary | ICD-10-CM | POA: Insufficient documentation

## 2018-03-03 DIAGNOSIS — G4709 Other insomnia: Secondary | ICD-10-CM

## 2018-03-03 DIAGNOSIS — D1809 Hemangioma of other sites: Secondary | ICD-10-CM | POA: Diagnosis not present

## 2018-03-03 DIAGNOSIS — M2560 Stiffness of unspecified joint, not elsewhere classified: Secondary | ICD-10-CM | POA: Insufficient documentation

## 2018-03-03 DIAGNOSIS — E559 Vitamin D deficiency, unspecified: Secondary | ICD-10-CM

## 2018-03-03 DIAGNOSIS — M2550 Pain in unspecified joint: Secondary | ICD-10-CM | POA: Insufficient documentation

## 2018-03-03 DIAGNOSIS — E782 Mixed hyperlipidemia: Secondary | ICD-10-CM | POA: Diagnosis not present

## 2018-03-03 MED ORDER — ICOSAPENT ETHYL 1 G PO CAPS: 2.0000 g | ORAL_CAPSULE | Freq: Two times a day (BID) | ORAL | 1 refills | Status: DC

## 2018-03-03 MED ORDER — VITAMIN D (ERGOCALCIFEROL) 1.25 MG (50000 UNIT) PO CAPS: 50000.0000 [IU] | ORAL_CAPSULE | ORAL | 0 refills | Status: DC

## 2018-03-03 MED ORDER — ROSUVASTATIN CALCIUM 5 MG PO TABS: 5.0000 mg | ORAL_TABLET | Freq: Every day | ORAL | 1 refills | Status: DC

## 2018-03-03 NOTE — Progress Notes (Signed)
Name: Kimberly Cantrell   MRN: [id number]    DOB: Jul 25, 1965   Date:03/03/2018       Progress Note  Subjective  Chief Complaint  Chief Complaint  Patient presents with  . Establish Care    HPI  Pt presents to establish care and for the following:  Social: She was seeing Dr. Enzo Bi with GYN for primary care, she has not had a PCP in many years. She has 3 children who are in high school and college, married to female, works as Primary school teacher with [employer].  HLD: Sips on sweet tea and has soda sometimes, does not eat many fried/fatty foods.  Maternal grandmother had MI in her 38's. Denies chest pain or shortness of breath.  We will start crestor at low dose as she is already having arthralgias and family history (mother) or myalgias with crestor.  We will also start vascepa.   Vitamin D Deficiency: Has not started the 50,000IU.  We will Rx today and will recheck in 3 months.  Chronic Back Pain/Joint Pain: She has hemangioma on her spine which has restricted her ability to have surgery.  Was seeing Dr. Oliva Bustard in 2017 - most recent imaging was 06/09/2015.  She helped move her aunt from one home to another over Christmas and her thoracic spine developed quite a bit of pain - she notes history of thoracic spine fracture in the past  We will refer to neurosurgery today for spinal issues.  Also has knees, elbows, hips, shoulders, and hands are all sore bilaterally - worse with the cold, and worse in the morning.   Intermittent Bilateral Blurred vision: She notes worse at night when watching TV - history of the same in the past, but was related to sinusitis and other times related to not eating enough in the day, or after eating large meal..  She has normal BP today.  Last eye examination was December.  Denies polyphagia or polyuria. Does endorse some mild polydipsia.  She will keep log of blurred vision in preparation for follow up. No headaches, confusion, slurred speech, facial droop, no  complete loss of vision or "curtain" effect.  We will check labs today.  Patient Active Problem List   Diagnosis Date Noted  . Vaginal atrophy 10/27/2016  . Perimenopausal vasomotor symptoms 10/27/2016  . Well woman exam with routine gynecological exam 10/07/2015  . Endometriosis 10/07/2015  . Bone lesion 02/07/2015    Past Surgical History:  Procedure Laterality Date  . ABDOMINAL HYSTERECTOMY    . CHOLECYSTECTOMY  2007  . HERNIA REPAIR     age 19  . NASAL SINUS SURGERY     age 49  . OOPHORECTOMY    . PARTIAL HYSTERECTOMY     1 ovary remaining    Family History  Problem Relation Age of Onset  . Heart attack Father   . Diabetes Father   . Diabetes Maternal Aunt   . Diabetes Maternal Uncle   . Diabetes Paternal Uncle   . Diabetes Maternal Grandmother   . Diabetes Paternal Grandmother   . Cancer Neg Hx   . Ovarian cancer Neg Hx     Social History   Socioeconomic History  . Marital status: Married    Spouse name: Not on file  . Number of children: Not on file  . Years of education: Not on file  . Highest education level: Not on file  Occupational History  . Not on file  Social Needs  . Emergency planning/management officer  strain: Not hard at all  . Food insecurity:    Worry: Never true    Inability: Never true  . Transportation needs:    Medical: No    Non-medical: No  Tobacco Use  . Smoking status: Never Smoker  . Smokeless tobacco: Never Used  Substance and Sexual Activity  . Alcohol use: No    Alcohol/week: 0.0 standard drinks  . Drug use: No  . Sexual activity: Yes    Partners: Male    Birth control/protection: Surgical  Lifestyle  . Physical activity:    Days per week: 0 days    Minutes per session: 0 min  . Stress: Not at all  Relationships  . Social connections:    Talks on phone: More than three times a week    Gets together: More than three times a week    Attends religious service: More than 4 times per year    Active member of club or organization: Yes      Attends meetings of clubs or organizations: More than 4 times per year    Relationship status: Married  . Intimate partner violence:    Fear of current or ex partner: No    Emotionally abused: No    Physically abused: No    Forced sexual activity: No  Other Topics Concern  . Not on file  Social History Narrative  . Not on file     Current Outpatient Medications:  .  Naproxen Sodium (ALEVE) 220 MG CAPS, Take 1-2 capsule by mouth three times a day as needed for pain., Disp: , Rfl:  .  zolpidem (AMBIEN) 10 MG tablet, TAKE 1 TABLET BY MOUTH EVERY DAY, Disp: 30 tablet, Rfl: 5  Allergies  Allergen Reactions  . Metronidazole Other (See Comments)  . Clindamycin Hcl Rash  . Ketoconazole Rash    I personally reviewed active problem list, medication list, allergies, notes from last encounter, lab results with the patient/caregiver today.   ROS Constitutional: Negative for fever or weight change.  Respiratory: Negative for cough and shortness of breath.   Cardiovascular: Negative for chest pain or palpitations.  Gastrointestinal: Negative for abdominal pain, no bowel changes.  Musculoskeletal: Negative for gait problem or joint swelling.  Skin: Negative for rash.  Neurological: Negative for dizziness or headache.  No other specific complaints in a complete review of systems (except as listed in HPI above).  Objective  Vitals:   03/03/18 1257  BP: 110/70  Pulse: 98  Resp: 16  Temp: 98.1 F (36.7 C)  TempSrc: Oral  SpO2: 98%  Weight: 153 lb 6.4 oz (69.6 kg)  Height: 5' 7.5" (1.715 m)   Body mass index is 23.67 kg/m.  Physical Exam Constitutional: Patient appears well-developed and well-nourished. No distress.  HENT: Head: Normocephalic and atraumatic.  Eyes: Conjunctivae and EOM are normal. No scleral icterus.  Pupils are equal, round, and reactive to light.  Neck: Normal range of motion. Neck supple. No JVD present. No thyromegaly present.  Cardiovascular: Normal  rate, regular rhythm and normal heart sounds.  No murmur heard. No BLE edema. Pulmonary/Chest: Effort normal and breath sounds normal. No respiratory distress. Abdominal: Soft. Bowel sounds are normal, no distension. There is no tenderness. No masses. Musculoskeletal: Normal range of motion, no joint effusions. No gross deformities Neurological: Pt is alert and oriented to person, place, and time. No cranial nerve deficit. Coordination, balance, strength, speech and gait are normal.  Skin: Skin is warm and dry. No rash noted. No erythema.  Psychiatric: Patient has a normal mood and affect. behavior is normal. Judgment and thought content normal.  No results found for this or any previous visit (from the past 72 hour(s)).  PHQ2/9: Depression screen PHQ 2/9 03/03/2018  Decreased Interest 0  Down, Depressed, Hopeless 0  PHQ - 2 Score 0   Fall Risk: Fall Risk  03/03/2018  Falls in the past year? 0  Number falls in past yr: 0  Injury with Fall? 0   Functional Status Survey: Is the patient deaf or have difficulty hearing?: No Does the patient have difficulty seeing, even when wearing glasses/contacts?: Yes Does the patient have difficulty concentrating, remembering, or making decisions?: No Does the patient have difficulty walking or climbing stairs?: No Does the patient have difficulty dressing or bathing?: No Does the patient have difficulty doing errands alone such as visiting a doctor's office or shopping?: No  Assessment & Plan  1. Spinal hemangioma - She has not had formal evaluation by neurosurgeon, and is having pain that causes her to use ambien at night to help her sleep.  Referral is placed. She is aware of our office policy regarding ambien, benzos, and opiates. . - Ambulatory referral to Neurosurgery  2. Arthralgia, unspecified joint - Sed Rate (ESR) - C-reactive protein - COMPLETE METABOLIC PANEL WITH GFR - CBC w/Diff/Platelet - ANA  3. Joint stiffness - Sed Rate  (ESR) - C-reactive protein - ANA  4. Mixed hyperlipidemia - Icosapent Ethyl 1 g CAPS; Take 2 capsules (2 g total) by mouth 2 (two) times daily.  Dispense: 180 capsule; Refill: 1 - rosuvastatin (CRESTOR) 5 MG tablet; Take 1 tablet (5 mg total) by mouth daily.  Dispense: 90 tablet; Refill: 1  5. Vitamin D deficiency - Vitamin D, Ergocalciferol, (DRISDOL) 1.25 MG (50000 UT) CAPS capsule; Take 1 capsule (50,000 Units total) by mouth every 7 (seven) days.  Dispense: 12 capsule; Refill: 0  6. Blurred vision - Advised she have repeat eye examination, we will check labs, red flags and when to present to ER discussed in detail. - Sed Rate (ESR) - C-reactive protein - COMPLETE METABOLIC PANEL WITH GFR - CBC w/Diff/Platelet - ANA - Vitamin D, Ergocalciferol, (DRISDOL) 1.25 MG (50000 UT) CAPS capsule; Take 1 capsule (50,000 Units total) by mouth every 7 (seven) days.  Dispense: 12 capsule; Refill: 0  7. Other insomnia - She is made aware of our office policy regarding ambien Rx and verbalizes understanding that she will likely not be prescribed the medication by Dr. Sanda Klein at her follow up either.

## 2018-03-03 NOTE — Patient Instructions (Signed)
After taking 12-week course of prescription vitamin D, start taking 2000IU OTC daily.

## 2018-03-06 DIAGNOSIS — G4709 Other insomnia: Secondary | ICD-10-CM | POA: Insufficient documentation

## 2018-03-06 LAB — CBC WITH DIFFERENTIAL/PLATELET
Absolute Monocytes: 451 cells/uL (ref 200–950)
Basophils Absolute: 59 cells/uL (ref 0–200)
Basophils Relative: 0.6 %
Eosinophils Absolute: 147 cells/uL (ref 15–500)
Eosinophils Relative: 1.5 %
HCT: 39.2 % (ref 35.0–45.0)
Hemoglobin: 13.7 g/dL (ref 11.7–15.5)
Lymphs Abs: 3018 cells/uL (ref 850–3900)
MCH: 31.6 pg (ref 27.0–33.0)
MCHC: 34.9 g/dL (ref 32.0–36.0)
MCV: 90.3 fL (ref 80.0–100.0)
MONOS PCT: 4.6 %
MPV: 10.4 fL (ref 7.5–12.5)
Neutro Abs: 6125 cells/uL (ref 1500–7800)
Neutrophils Relative %: 62.5 %
Platelets: 322 10*3/uL (ref 140–400)
RBC: 4.34 10*6/uL (ref 3.80–5.10)
RDW: 12.3 % (ref 11.0–15.0)
Total Lymphocyte: 30.8 %
WBC: 9.8 10*3/uL (ref 3.8–10.8)

## 2018-03-06 LAB — COMPLETE METABOLIC PANEL WITH GFR
AG Ratio: 1.7 (calc) (ref 1.0–2.5)
ALT: 17 U/L (ref 6–29)
AST: 17 U/L (ref 10–35)
Albumin: 4.7 g/dL (ref 3.6–5.1)
Alkaline phosphatase (APISO): 125 U/L (ref 33–130)
BUN: 10 mg/dL (ref 7–25)
CALCIUM: 10.1 mg/dL (ref 8.6–10.4)
CO2: 28 mmol/L (ref 20–32)
Chloride: 102 mmol/L (ref 98–110)
Creat: 0.79 mg/dL (ref 0.50–1.05)
GFR, Est African American: 100 mL/min/{1.73_m2} (ref >=60)
GFR, Est Non African American: 86 mL/min/{1.73_m2} (ref >=60)
Globulin: 2.7 g/dL (calc) (ref 1.9–3.7)
Glucose, Bld: 74 mg/dL (ref 65–99)
Potassium: 3.8 mmol/L (ref 3.5–5.3)
Sodium: 143 mmol/L (ref 135–146)
Total Bilirubin: 0.4 mg/dL (ref 0.2–1.2)
Total Protein: 7.4 g/dL (ref 6.1–8.1)

## 2018-03-06 LAB — ANA: Anti Nuclear Antibody(ANA): NEGATIVE

## 2018-03-06 LAB — SEDIMENTATION RATE: SED RATE: 19 mm/h (ref 0–30)

## 2018-03-06 LAB — C-REACTIVE PROTEIN: CRP: 15.1 mg/L — ABNORMAL HIGH (ref <8.0)

## 2018-04-06 ENCOUNTER — Encounter (INDEPENDENT_AMBULATORY_CARE_PROVIDER_SITE_OTHER): Payer: Self-pay

## 2018-04-06 ENCOUNTER — Encounter: Payer: Self-pay | Admitting: Family Medicine

## 2018-04-06 ENCOUNTER — Ambulatory Visit: Payer: BC Managed Care – PPO | Admitting: Family Medicine

## 2018-04-06 VITALS — BP 112/74 | HR 92 | Temp 97.9°F | Resp 12 | Ht 68.0 in | Wt 155.5 lb

## 2018-04-06 DIAGNOSIS — G4709 Other insomnia: Secondary | ICD-10-CM

## 2018-04-06 DIAGNOSIS — M2560 Stiffness of unspecified joint, not elsewhere classified: Secondary | ICD-10-CM

## 2018-04-06 DIAGNOSIS — M2550 Pain in unspecified joint: Secondary | ICD-10-CM

## 2018-04-06 DIAGNOSIS — D1809 Hemangioma of other sites: Secondary | ICD-10-CM

## 2018-04-06 DIAGNOSIS — E782 Mixed hyperlipidemia: Secondary | ICD-10-CM

## 2018-04-06 DIAGNOSIS — E559 Vitamin D deficiency, unspecified: Secondary | ICD-10-CM

## 2018-04-06 DIAGNOSIS — R7982 Elevated C-reactive protein (CRP): Secondary | ICD-10-CM

## 2018-04-06 NOTE — Assessment & Plan Note (Signed)
Explained that I am not a fan of ambien; cited 2012 study, encouraged treating the underlying pain or other options rather than sedative hypnotics; she wishes to continue to use what she has from her other doctor and may consider trazodone in the future; I will not prescribe Lorrin Mais

## 2018-04-06 NOTE — Assessment & Plan Note (Signed)
Check level today, urged supplementation; explained aching may be from very low vitamin D

## 2018-04-06 NOTE — Progress Notes (Signed)
 BP 112/74   Pulse 92   Temp 97.9 F (36.6 C) (Oral)   Resp 12   Ht 5' 8" (1.727 m)   Wt 155 lb 8 oz (70.5 kg)   SpO2 97%   BMI 23.64 kg/m    Subjective:    Patient ID: Kimberly Cantrell, female    DOB: 05/08/1965, 53 y.o.   MRN: [id number]  HPI: Kimberly Cantrell is a 53 y.o. female  Chief Complaint  Patient presents with  . Follow-up  . Insomnia    with pain  . Joint Pain  . Headache    with vision issues    HPI  She is here to establish care with me Lots of joint issues She has vitamin D deficiency and was put on something for that Hemangioma on her spine that causes a lot of pain, does not want to have surgery any time soon Vitamin D was really low at 7.4; just started the level in the last week CRP was 15.1 She has had a lot of tick bites over her life; has been on doxy; did have a rash with one of those Joints get sore to the touch; worse when cloudy and colder; some days she just hurts so badly; then she takes naproxen, that messes her stomach up; starts tidal wave of things, then acid reflux with 2 naproxen a day; can't take the pain either She has not seen a rheumatologist  High cholesterol; runs in the family; step-grandmother lived to be 92 and had terrible cholesterol; mother is 81 and she runs high cholesterol and she is thin and full of energy nonfasting when that was drawn on 01/25/2018; really bad headaches at the time, felt dizzy; body was off for a few weeks; that has balanced out; she has eaten today, little bit of tea Mother did horrible with Zocor  She has the spinal hemangioma; found it a few years ago, at first thought possible met or cancer; several people looked at it and wanted to keep a check on it; they were were concerned and wanted it followed; does cause quite a bit of pain; fragile in that area; she has someone and can set up that appointment herself IMPRESSION: 1. Similar appearance and similar size of the speckled lesion at the T12 level  with enhancement and high T2 signal but primarily reduced to intermediate T1 signal. This documents 4 months of size stability. I concur with the prior assessment that this lesion was only about 7 mm in diameter on 06/18/2011. Given the numerous surrounding hemangiomas, this may well represent an atypical hemangioma which has enlarged. It does have a corduroy appearance of trabeculation which often goes along with hemangioma. Again, nuclear medicine bone scan might be helpful. Otherwise periodic surveillance imaging by MRI would be suggested. 2. Spondylosis and degenerative disc disease cause moderate impingement at C7-T1 ; mild impingement at L4-5 ; and borderline impingement at L3-4, as detailed above.   Electronically Signed   By: Walter  Liebkemann M.D.  Headache with vision issues was for a while back; saw NP here; she wasn't sure what was going away; that just calmed down  Dr. D put her on Ambien; she takes a little bit of that to just knock her out and she sleeps well; she hopes to not need it when she has surgery in a few years; she'll finish out what she has  She has had some injuries to her neck when hiking; those injuries last; feels the   effects of them; has to be careful with pillows; surgery in a few years which will take away the pain; tried the injections; left arm will go numb at night  Soft lump on the medial right foot; just came up a few months ago; no erythema  Depression screen [facility] 2/9 04/06/2018 03/03/2018  Decreased Interest 0 0  Down, Depressed, Hopeless 0 0  PHQ - 2 Score 0 0  Altered sleeping 0 -  Tired, decreased energy 0 -  Change in appetite 0 -  Feeling bad or failure about yourself  0 -  Trouble concentrating 0 -  Moving slowly or fidgety/restless 0 -  Suicidal thoughts 0 -  PHQ-9 Score 0 -  Difficult doing work/chores Not difficult at all -   Fall Risk  04/06/2018 03/03/2018  Falls in the past year? 0 0  Number falls in past yr: 0 0  Injury with  Fall? 0 0    Relevant past medical, surgical, family and social history reviewed Past Medical History:  Diagnosis Date  . Bone lesion 02/07/2015  . DDD (degenerative disc disease), cervical   . DJD (degenerative joint disease) of cervical spine   . Endometriosis   . Vitamin D deficiency    Past Surgical History:  Procedure Laterality Date  . ABDOMINAL HYSTERECTOMY    . CHOLECYSTECTOMY  2007  . HERNIA REPAIR     age 32  . NASAL SINUS SURGERY     age 48  . OOPHORECTOMY    . PARTIAL HYSTERECTOMY     1 ovary remaining   Family History  Problem Relation Age of Onset  . Heart attack Father   . Diabetes Father   . Diabetes Maternal Aunt   . Diabetes Maternal Uncle   . Diabetes Paternal Uncle   . Diabetes Maternal Grandmother   . Diabetes Paternal Grandmother   . Cancer Neg Hx   . Ovarian cancer Neg Hx    Social History   Tobacco Use  . Smoking status: Never Smoker  . Smokeless tobacco: Never Used  Substance Use Topics  . Alcohol use: No    Alcohol/week: 0.0 standard drinks  . Drug use: No     Office Visit from 04/06/2018 in [facility]  AUDIT-C Score  0      Interim medical history since last visit reviewed. Allergies and medications reviewed  Review of Systems Per HPI unless specifically indicated above     Objective:    BP 112/74   Pulse 92   Temp 97.9 F (36.6 C) (Oral)   Resp 12   Ht 5' 8" (1.727 m)   Wt 155 lb 8 oz (70.5 kg)   SpO2 97%   BMI 23.64 kg/m   Wt Readings from Last 3 Encounters:  04/06/18 155 lb 8 oz (70.5 kg)  03/03/18 153 lb 6.4 oz (69.6 kg)  01/25/18 153 lb 3.2 oz (69.5 kg)    Physical Exam Constitutional:      General: She is not in acute distress.    Appearance: She is well-developed and normal weight. She is not diaphoretic.  HENT:     Head: Normocephalic and atraumatic.     Mouth/Throat:     Mouth: Mucous membranes are moist.  Eyes:     General: No scleral icterus.    Extraocular Movements:  Extraocular movements intact.  Neck:     Thyroid: No thyromegaly.  Cardiovascular:     Rate and Rhythm: Normal rate and regular rhythm.  Heart sounds: Normal heart sounds. No murmur.  Pulmonary:     Effort: Pulmonary effort is normal. No respiratory distress.     Breath sounds: Normal breath sounds. No wheezing.  Abdominal:     General: Bowel sounds are normal. There is no distension.     Palpations: Abdomen is soft.  Musculoskeletal:     Right hand: She exhibits normal range of motion, no tenderness, no bony tenderness, no deformity and no swelling. Normal strength noted.     Left hand: She exhibits normal range of motion, no tenderness, no bony tenderness, no deformity and no swelling. Normal strength noted.  Skin:    General: Skin is warm and dry.     Coloration: Skin is not pale.     Comments: Fair complected but not pale  Neurological:     Mental Status: She is alert.  Psychiatric:        Behavior: Behavior normal.        Thought Content: Thought content normal.        Judgment: Judgment normal.     Results for orders placed or performed in visit on 03/03/18  Sed Rate (ESR)  Result Value Ref Range   Sed Rate 19 0 - 30 mm/h  C-reactive protein  Result Value Ref Range   CRP 15.1 (H) <8.0 mg/L  COMPLETE METABOLIC PANEL WITH GFR  Result Value Ref Range   Glucose, Bld 74 65 - 99 mg/dL   BUN 10 7 - 25 mg/dL   Creat 0.79 0.50 - 1.05 mg/dL   GFR, Est Non African American 86 > OR = 60 mL/min/1.73m2   GFR, Est African American 100 > OR = 60 mL/min/1.73m2   BUN/Creatinine Ratio NOT APPLICABLE 6 - 22 (calc)   Sodium 143 135 - 146 mmol/L   Potassium 3.8 3.5 - 5.3 mmol/L   Chloride 102 98 - 110 mmol/L   CO2 28 20 - 32 mmol/L   Calcium 10.1 8.6 - 10.4 mg/dL   Total Protein 7.4 6.1 - 8.1 g/dL   Albumin 4.7 3.6 - 5.1 g/dL   Globulin 2.7 1.9 - 3.7 g/dL (calc)   AG Ratio 1.7 1.0 - 2.5 (calc)   Total Bilirubin 0.4 0.2 - 1.2 mg/dL   Alkaline phosphatase (APISO) 125 33 - 130  U/L   AST 17 10 - 35 U/L   ALT 17 6 - 29 U/L  CBC w/Diff/Platelet  Result Value Ref Range   WBC 9.8 3.8 - 10.8 Thousand/uL   RBC 4.34 3.80 - 5.10 Million/uL   Hemoglobin 13.7 11.7 - 15.5 g/dL   HCT 39.2 35.0 - 45.0 %   MCV 90.3 80.0 - 100.0 fL   MCH 31.6 27.0 - 33.0 pg   MCHC 34.9 32.0 - 36.0 g/dL   RDW 12.3 11.0 - 15.0 %   Platelets 322 140 - 400 Thousand/uL   MPV 10.4 7.5 - 12.5 fL   Neutro Abs 6,125 1,500 - 7,800 cells/uL   Lymphs Abs 3,018 850 - 3,900 cells/uL   Absolute Monocytes 451 200 - 950 cells/uL   Eosinophils Absolute 147 15 - 500 cells/uL   Basophils Absolute 59 0 - 200 cells/uL   Neutrophils Relative % 62.5 %   Total Lymphocyte 30.8 %   Monocytes Relative 4.6 %   Eosinophils Relative 1.5 %   Basophils Relative 0.6 %  ANA  Result Value Ref Range   Anti Nuclear Antibody(ANA) NEGATIVE NEGATIVE      Assessment & Plan:   Problem   List Items Addressed This Visit      Other   Mixed hyperlipidemia   Relevant Orders   Lipid panel   Arthralgia   Relevant Orders   C-reactive protein   ANA,IFA RA Diag Pnl w/rflx Tit/Patn   Ambulatory referral to Rheumatology   Vitamin D deficiency - Primary    Check level today, urged supplementation; explained aching may be from very low vitamin D      Relevant Orders   VITAMIN D 25 Hydroxy (Vit-D Deficiency, Fractures)   Spinal hemangioma    Order lumbar spine MRI with and without contrast; patient can schedule neurosurgeon appt at [facility]      Relevant Orders   MR Lumbar Spine W Wo Contrast   Other insomnia    Explained that I am not a fan of ambien; cited 2012 study, encouraged treating the underlying pain or other options rather than sedative hypnotics; she wishes to continue to use what she has from her other doctor and may consider trazodone in the future; I will not prescribe ambien      Joint stiffness    Refer to rheum; encouraged vitamin D supplementation; check other labs      Relevant Orders   C-reactive  protein   ANA,IFA RA Diag Pnl w/rflx Tit/Patn   Ambulatory referral to Rheumatology    Other Visit Diagnoses    Elevated C-reactive protein (CRP)       Relevant Orders   C-reactive protein   ANA,IFA RA Diag Pnl w/rflx Tit/Patn   Ambulatory referral to Rheumatology       Follow up plan: Return in about 4 weeks (around 05/04/2018) for follow-up visit with Dr. Lada.  An after-visit summary was printed and given to the patient at check-out.  Please see the patient instructions which may contain other information and recommendations beyond what is mentioned above in the assessment and plan.  No orders of the defined types were placed in this encounter.   Orders Placed This Encounter  Procedures  . MR Lumbar Spine W Wo Contrast  . C-reactive protein  . VITAMIN D 25 Hydroxy (Vit-D Deficiency, Fractures)  . Lipid panel  . ANA,IFA RA Diag Pnl w/rflx Tit/Patn  . Ambulatory referral to Rheumatology       

## 2018-04-06 NOTE — Patient Instructions (Addendum)
Be aware of the risks of Ambien and other sedative hypnotics Be aware also that the maximum recommended dose of Ambien for women is 5 mg per night We'll get the labs today We'll have you see the rheumatologist We'll get the MRI of the lower back You can call neurosurgery at [facility] for an appointment If you have not heard anything from my staff in a week about any orders/referrals/studies from today, please contact us here to follow-up [phone]

## 2018-04-06 NOTE — Assessment & Plan Note (Signed)
Order lumbar spine MRI with and without contrast; patient can schedule neurosurgeon appt at [facility]

## 2018-04-06 NOTE — Assessment & Plan Note (Signed)
Refer to rheum; encouraged vitamin D supplementation; check other labs

## 2018-04-07 LAB — ANA,IFA RA DIAG PNL W/RFLX TIT/PATN
Anti Nuclear Antibody(ANA): NEGATIVE
Cyclic Citrullin Peptide Ab: 142 UNITS — ABNORMAL HIGH
Rheumatoid fact SerPl-aCnc: <14 IU/mL (ref <14)

## 2018-04-07 LAB — LIPID PANEL
Cholesterol: 235 mg/dL — ABNORMAL HIGH (ref <200)
HDL: 53 mg/dL (ref >=50)
LDL Cholesterol (Calc): 153 mg/dL (calc) — ABNORMAL HIGH
Non-HDL Cholesterol (Calc): 182 mg/dL (calc) — ABNORMAL HIGH (ref <130)
Total CHOL/HDL Ratio: 4.4 (calc) (ref <5.0)
Triglycerides: 154 mg/dL — ABNORMAL HIGH (ref <150)

## 2018-04-07 LAB — C-REACTIVE PROTEIN: CRP: 12.9 mg/L — ABNORMAL HIGH (ref <8.0)

## 2018-04-07 LAB — VITAMIN D 25 HYDROXY (VIT D DEFICIENCY, FRACTURES): Vit D, 25-Hydroxy: 12 ng/mL — ABNORMAL LOW (ref 30–100)

## 2018-04-09 ENCOUNTER — Encounter: Payer: Self-pay | Admitting: Family Medicine

## 2018-04-09 DIAGNOSIS — R7989 Other specified abnormal findings of blood chemistry: Secondary | ICD-10-CM

## 2018-04-09 DIAGNOSIS — R768 Other specified abnormal immunological findings in serum: Secondary | ICD-10-CM

## 2018-04-09 HISTORY — DX: Other specified abnormal immunological findings in serum: R76.8

## 2018-04-09 HISTORY — DX: Other specified abnormal findings of blood chemistry: R79.89

## 2018-04-09 NOTE — Progress Notes (Signed)
I personally called and left detailed message about the strongly positive CCP; she can read about this at labtestsonline on the internet; we'll forward that to our rheumatology colleagues and get her some help  Joelene Millin, please make sure either you or M. Weikart forward these labs along to [facility]; I put in a referral, but the labs are new and very important; we want them to see the very high CCP antibody result, as that should get her in quickly  Please call patient, let her know the CCP is high, so this may be rheumatoid arthritis and we'll get her to [facility] to get her treated Her vitamin D is very low, so we'll urge her to take the vitamin D prescription (I believe another doctor got that for her and she's just started to take it); we usually do that for 8-12 weeks and then recheck; long-term high doses of vitamin D are not healthy as we can go too high, but 8-12 weeks won't be a problem at all; we just want her to know she won't be on that for months or years; just long enough to build her levels up  Her cholesterol is high, but not to the point of needing medicine; let's just encourage her to limit saturated fats and get plenty of fruits and veggies; her TG are much better, so she must have made some changes already; keep it up!  The 10-year ASCVD risk score Mikey Bussing DC Brooke Bonito., et al., 2013) is: 1.5%   Values used to calculate the score:     Age: 53 years     Sex: Female     Is Non-Hispanic African American: No     Diabetic: No     Tobacco smoker: No     Systolic Blood Pressure: 102 mmHg     Is BP treated: No     HDL Cholesterol: 53 mg/dL     Total Cholesterol: 235 mg/dL

## 2018-04-18 DIAGNOSIS — R7982 Elevated C-reactive protein (CRP): Secondary | ICD-10-CM | POA: Insufficient documentation

## 2018-04-26 ENCOUNTER — Ambulatory Visit: Admission: RE | Admit: 2018-04-26 | Payer: BC Managed Care – PPO | Source: Ambulatory Visit

## 2018-05-10 ENCOUNTER — Other Ambulatory Visit: Payer: Self-pay

## 2018-05-10 ENCOUNTER — Telehealth (INDEPENDENT_AMBULATORY_CARE_PROVIDER_SITE_OTHER): Payer: BC Managed Care – PPO | Admitting: Nurse Practitioner

## 2018-05-10 DIAGNOSIS — R062 Wheezing: Secondary | ICD-10-CM | POA: Insufficient documentation

## 2018-05-10 DIAGNOSIS — M2550 Pain in unspecified joint: Secondary | ICD-10-CM | POA: Diagnosis not present

## 2018-05-10 DIAGNOSIS — G4709 Other insomnia: Secondary | ICD-10-CM

## 2018-05-10 DIAGNOSIS — E782 Mixed hyperlipidemia: Secondary | ICD-10-CM

## 2018-05-10 DIAGNOSIS — E559 Vitamin D deficiency, unspecified: Secondary | ICD-10-CM | POA: Diagnosis not present

## 2018-05-10 DIAGNOSIS — J4520 Mild intermittent asthma, uncomplicated: Secondary | ICD-10-CM | POA: Insufficient documentation

## 2018-05-10 MED ORDER — TRAZODONE HCL 50 MG PO TABS: 50.0000 mg | ORAL_TABLET | Freq: Every evening | ORAL | 3 refills | Status: DC | PRN

## 2018-05-10 MED ORDER — ALBUTEROL SULFATE HFA 108 (90 BASE) MCG/ACT IN AERS: 2.0000 | INHALATION_SPRAY | Freq: Four times a day (QID) | RESPIRATORY_TRACT | 1 refills | Status: AC | PRN

## 2018-05-10 NOTE — Progress Notes (Signed)
Virtual Visit via Telephone Note  I connected withMelanie B Cantrell on 05/10/18 at  9:20 AM EDT by telephoneand verified that I am speaking with the correct person using two identifiers.  Staff discussed the limitations, risks, security and privacy concerns of performing an evaluation and management service by telephone and the availability of in person appointments. I also discussed with the patient that there may be a patient responsible charge related to this service. The patient expressed understanding and agreed to proceed.   History of Present Illness:  Patient presents for 4 week follow-up; established care with new PCP- Dr. Sanda Klein  Hyperlipidemia Patient prescribed vascepa but has not taken it. Recently saw cardiologist Dr. Rockey Situ due to chest pain- but think its referred back pain but still was checked out.   Arthralgia Saw Dr. Annalee Genta on 04/18/2018 for elevated anti-CCP; trialed on plaquenil 200mg  BID; states has not started to taking it. States on cold, rainy days she feels it a lot more- notices that is weather related- hips are the worst.   Insomnia Takes ambien to help with sleep, rx by Dr. Keturah Barre- states does not want to trial something different at this time; helps because when she lays down has severe back pain and cannot sleep. When she does not take [place] on days she doesn't work and she tosses and turns throughout the night.   Vitamin D deficiency Started vitamin D supplementation for 2 weeks.   Vertebral Hemangioma Plans for surgery but not for at least a few years   Seasonal allergies Runny nose, itchy eyes Went on a walk had to use rescue inhaler   Observations/Objective:   Assessment and Plan:   Follow Up Instructions:  4 months  I discussed the assessment and treatment plan with the patient. The patient was provided an opportunity to ask questions and all were answered. The patient agreed with the plan and demonstrated an understanding  of the instructions.  The patient was advised to call back or seek an in-person evaluation if the symptoms worsen or if the condition fails to improve as anticipated.  I provided 23 minutes of non-face-to-face time during this encounter.   [name], NP

## 2018-05-10 NOTE — H&P (Signed)
Virtual Visit via Telephone Note  I connected with Kimberly Cantrell on 05/10/18 at  9:20 AM EDT by telephone and verified that I am speaking with the correct person using two identifiers.   Staff discussed the limitations, risks, security and privacy concerns of performing an evaluation and management service by telephone and the availability of in person appointments. I also discussed with the patient that there may be a patient responsible charge related to this service. The patient expressed understanding and agreed to proceed.   History of Present Illness:  Patient presents for 4 week follow-up; established care with new PCP- Dr. Sanda Klein  Hyperlipidemia Patient prescribed vascepa but has not taken it. Recently saw cardiologist Dr. Rockey Situ due to chest pain- but think its referred back pain but still was checked out.   Arthralgia Saw Dr. Annalee Genta on 04/18/2018 for elevated anti-CCP; trialed on plaquenil 200mg  BID; states has not started to taking it. States on cold, rainy days she feels it a lot more- notices that is weather related- hips are the worst.   Insomnia Takes ambien to help with sleep, rx by Dr. Keturah Barre- states does not want to trial something different at this time; helps because when she lays down has severe back pain and cannot sleep. When she does not take [place] on days she doesn't work and she tosses and turns throughout the night.   Vitamin D deficiency Started vitamin D supplementation for 2 weeks.   Vertebral Hemangioma Plans for surgery but not for at least a few years   Seasonal allergies Runny nose, itchy eyes Went on a walk had to use rescue inhaler   Observations/Objective:   Assessment and Plan:   Follow Up Instructions:   4 months  I discussed the assessment and treatment plan with the patient. The patient was provided an opportunity to ask questions and all were answered. The patient agreed with the plan and demonstrated an understanding of the  instructions.   The patient was advised to call back or seek an in-person evaluation if the symptoms worsen or if the condition fails to improve as anticipated.  I provided 23 minutes of non-face-to-face time during this encounter.   [name], NP

## 2018-06-05 ENCOUNTER — Other Ambulatory Visit: Payer: Self-pay | Admitting: Nurse Practitioner

## 2018-06-05 DIAGNOSIS — G4709 Other insomnia: Secondary | ICD-10-CM

## 2018-07-19 ENCOUNTER — Encounter: Payer: Self-pay | Admitting: Nurse Practitioner

## 2018-07-19 ENCOUNTER — Ambulatory Visit (INDEPENDENT_AMBULATORY_CARE_PROVIDER_SITE_OTHER): Payer: BC Managed Care – PPO | Admitting: Nurse Practitioner

## 2018-07-19 DIAGNOSIS — N898 Other specified noninflammatory disorders of vagina: Secondary | ICD-10-CM

## 2018-07-19 DIAGNOSIS — M069 Rheumatoid arthritis, unspecified: Secondary | ICD-10-CM | POA: Insufficient documentation

## 2018-07-19 MED ORDER — FLUCONAZOLE 150 MG PO TABS: 150.0000 mg | ORAL_TABLET | Freq: Once | ORAL | 0 refills | Status: AC

## 2018-07-19 NOTE — Progress Notes (Signed)
Virtual Visit via Video Note  I connected with Kimberly Cantrell on 07/19/18 at 10:00 AM EDT by a video enabled telemedicine application and verified that I am speaking with the correct person using two identifiers.   Staff discussed the limitations of evaluation and management by telemedicine and the availability of in person appointments. The patient expressed understanding and agreed to proceed.  Patient location: home  My location: work office Other people present: none HPI  Patient noticed vaginal irritation and pain and very itchy 2 days ago yesterday endorses clear-white vaginal discharge- looks like cottage cheese like. States noticed it got to be a lot of discharge. States was so irritating today did one dose of intravaginal monistat but does not want to do 7 day course do to difficulty would prefer oral. Has no issues with flucanazole in the past.   No recent antibiotics, no douching.    PHQ2/9: Depression screen [facility] 2/9 07/19/2018 05/10/2018 04/06/2018 03/03/2018  Decreased Interest 0 0 0 0  Down, Depressed, Hopeless 0 0 0 0  PHQ - 2 Score 0 0 0 0  Altered sleeping 0 0 0 -  Tired, decreased energy 0 0 0 -  Change in appetite 0 0 0 -  Feeling bad or failure about yourself  0 0 0 -  Trouble concentrating 0 0 0 -  Moving slowly or fidgety/restless 0 0 0 -  Suicidal thoughts 0 0 0 -  PHQ-9 Score 0 0 0 -  Difficult doing work/chores Not difficult at all Not difficult at all Not difficult at all -     PHQ reviewed. Negative  Patient Active Problem List   Diagnosis Date Noted  . RA (rheumatoid arthritis) ([place]) 07/19/2018  . Mild intermittent asthma 05/10/2018  . Wheezing 05/10/2018  . 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 Other insomnia 03/06/2018  . Vitamin D deficiency 03/03/2018  . Mixed hyperlipidemia 03/03/2018  . Joint stiffness 03/03/2018  . Arthralgia 03/03/2018  . Spinal hemangioma 03/03/2018  . Blurred vision 03/03/2018  . Vaginal  atrophy 10/27/2016  . Perimenopausal vasomotor symptoms 10/27/2016  . Well woman exam with routine gynecological exam 10/07/2015  . Endometriosis 10/07/2015  . Bone lesion 02/07/2015    Past Medical History:  Diagnosis Date  . Bone lesion 02/07/2015  . 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 DDD (degenerative disc disease), cervical   . DJD (degenerative joint disease) of cervical spine   . Endometriosis   . Vitamin D deficiency     Past Surgical History:  Procedure Laterality Date  . ABDOMINAL HYSTERECTOMY    . CHOLECYSTECTOMY  2007  . HERNIA REPAIR     age 83  . NASAL SINUS SURGERY     age 60  . OOPHORECTOMY    . PARTIAL HYSTERECTOMY     1 ovary remaining    Social History   Tobacco Use  . Smoking status: Never Smoker  . Smokeless tobacco: Never Used  Substance Use Topics  . Alcohol use: No    Alcohol/week: 0.0 standard drinks     Current Outpatient Medications:  .  albuterol (PROVENTIL HFA;VENTOLIN HFA) 108 (90 Base) MCG/ACT inhaler, Inhale 2 puffs into the lungs every 6 (six) hours as needed for wheezing or shortness of breath., Disp: 1 Inhaler, Rfl: 1 .  hydroxychloroquine (PLAQUENIL) 200 MG tablet, Take 200 mg by mouth as needed. , Disp: , Rfl:  .  Naproxen Sodium (ALEVE) 220 MG CAPS, Take 1-2 capsule  by mouth three times a day as needed for pain., Disp: , Rfl:  .  traZODone (DESYREL) 50 MG tablet, TAKE 1 TABLET (50 MG TOTAL) BY MOUTH AT BEDTIME AS NEEDED FOR SLEEP., Disp: 90 tablet, Rfl: 2 .  Vitamin D, Ergocalciferol, (DRISDOL) 1.25 MG (50000 UT) CAPS capsule, Take 1 capsule (50,000 Units total) by mouth every 7 (seven) days., Disp: 12 capsule, Rfl: 0 .  zolpidem (AMBIEN) 10 MG tablet, TAKE 1 TABLET BY MOUTH EVERY DAY (Patient taking differently: Take 10 mg by mouth as needed. TAKE 1 TABLET BY MOUTH EVERY DAY), Disp: 30 tablet, Rfl: 5  Allergies  Allergen Reactions  . Metronidazole Other (See Comments)  . Clindamycin Hcl  Rash  . Ketoconazole Rash    ROS   No other specific complaints in a complete review of systems (except as listed in HPI above).  Objective  There were no vitals filed for this visit.   There is no height or weight on file to calculate BMI.  Nursing Note and Vital Signs reviewed.  Physical Exam   Constitutional: Patient appears well-developed and well-nourished. No distress.  HENT: Head: Normocephalic and atraumatic. Pulmonary/Chest: Effort normal ,  Neurological: alert and oriented, speech normal.  Psychiatric: Patient has a normal mood and affect. behavior is normal. Judgment and thought content normal.    Assessment & Plan  1. Vaginal discharge - fluconazole (DIFLUCAN) 150 MG tablet; Take 1 tablet (150 mg total) by mouth once for 1 dose.  Dispense: 1 tablet; Refill: 0  2. Vaginal irritation - fluconazole (DIFLUCAN) 150 MG tablet; Take 1 tablet (150 mg total) by mouth once for 1 dose.  Dispense: 1 tablet; Refill: 0  Likely yeast infection- discussed if not resolved will come in for vaginal swab   Follow Up Instructions:   PRN I discussed the assessment and treatment plan with the patient. The patient was provided an opportunity to ask questions and all were answered. The patient agreed with the plan and demonstrated an understanding of the instructions.   The patient was advised to call back or seek an in-person evaluation if the symptoms worsen or if the condition fails to improve as anticipated.  I provided 11 minutes of non-face-to-face time during this encounter.   [name], NP

## 2018-10-19 ENCOUNTER — Telehealth: Payer: Self-pay | Admitting: Obstetrics and Gynecology

## 2018-10-19 ENCOUNTER — Other Ambulatory Visit: Payer: Self-pay | Admitting: Obstetrics and Gynecology

## 2018-10-19 MED ORDER — ZOLPIDEM TARTRATE 10 MG PO TABS: 10.0000 mg | ORAL_TABLET | ORAL | 3 refills | Status: DC | PRN

## 2018-10-19 NOTE — Telephone Encounter (Signed)
Patient is scheduled to see you in December for her annual. She was a previous D patient and needs a refill on Ambien. [facility] [address]. Thanks

## 2018-10-19 NOTE — Telephone Encounter (Signed)
pls advise

## 2018-10-24 ENCOUNTER — Other Ambulatory Visit: Payer: Self-pay | Admitting: Nurse Practitioner

## 2018-10-24 DIAGNOSIS — R062 Wheezing: Secondary | ICD-10-CM

## 2018-11-30 ENCOUNTER — Telehealth: Payer: Self-pay | Admitting: Obstetrics and Gynecology

## 2018-11-30 NOTE — Telephone Encounter (Signed)
Called and spoke with patient.  Advised to have Rheumatologist fill out exempt form since they follow her RA.  Patient verbalized understanding and will call Dr. Carlyn Reichert office.   Patient aware if they are not able to fill out form she can fax form to [place] to be signed.

## 2018-11-30 NOTE — Telephone Encounter (Signed)
Patient called requesting a note stating she can be exempt from getting the flu shot. She has a reaction every time she gets it due to RA making her joints ache. She can provide the form she just needed to make dure you could fill it out for her. She is aware Melody is out of the office. Thanks

## 2019-01-30 ENCOUNTER — Encounter: Payer: BC Managed Care – PPO | Admitting: Obstetrics and Gynecology

## 2019-02-02 ENCOUNTER — Encounter: Payer: BC Managed Care – PPO | Admitting: Certified Nurse Midwife

## 2019-02-28 ENCOUNTER — Other Ambulatory Visit: Payer: Self-pay

## 2019-02-28 ENCOUNTER — Encounter: Payer: Self-pay | Admitting: Certified Nurse Midwife

## 2019-02-28 ENCOUNTER — Ambulatory Visit (INDEPENDENT_AMBULATORY_CARE_PROVIDER_SITE_OTHER): Payer: BC Managed Care – PPO | Admitting: Certified Nurse Midwife

## 2019-02-28 VITALS — BP 85/65 | HR 104 | Ht 68.0 in | Wt 151.2 lb

## 2019-02-28 DIAGNOSIS — Z1231 Encounter for screening mammogram for malignant neoplasm of breast: Secondary | ICD-10-CM | POA: Diagnosis not present

## 2019-02-28 DIAGNOSIS — Z1211 Encounter for screening for malignant neoplasm of colon: Secondary | ICD-10-CM

## 2019-02-28 DIAGNOSIS — Z01419 Encounter for gynecological examination (general) (routine) without abnormal findings: Secondary | ICD-10-CM | POA: Diagnosis not present

## 2019-02-28 DIAGNOSIS — Z8739 Personal history of other diseases of the musculoskeletal system and connective tissue: Secondary | ICD-10-CM

## 2019-02-28 MED ORDER — ZOLPIDEM TARTRATE 10 MG PO TABS: 10.0000 mg | ORAL_TABLET | Freq: Every evening | ORAL | 3 refills | Status: DC | PRN

## 2019-02-28 NOTE — Patient Instructions (Signed)

## 2019-02-28 NOTE — Progress Notes (Signed)
GYNECOLOGY ANNUAL PREVENTATIVE CARE ENCOUNTER NOTE  History:     Kimberly Cantrell is a 54 y.o. G1P3003 female here for a routine annual gynecologic exam.  Current complaints: none.   Denies abnormal vaginal bleeding, discharge, pelvic pain, problems with intercourse or other gynecologic concerns.  History of fibroids and endometriosis, status post LAVH LSO and right salpingectomy, along with excisional biopsies of endometriosis, presents for routine annual gynecologic exam    Social: Married: heterosexual Lives with Family ( 3 children 21,20,17) Exercise: daily stretching 10-15 min No smoking/drinking/ drug use Works: [employer]  Gynecologic History No LMP recorded. Patient has had a hysterectomy. Contraception: status post hysterectomy and has one ovary Last Pap: 2012. Results were: normal Last mammogram: 2016. Results were: normal  Obstetric History OB History  Gravida Para Term Preterm AB Living  3 3 3     3   SAB TAB Ectopic Multiple Live Births          3    # Outcome Date GA Lbr Len/2nd Weight Sex Delivery Anes PTL Lv  3 Term 2003    F Vag-Spont   LIV  2 Term 2000    M Vag-Spont   LIV  1 Term 1998    F Vag-Spont   LIV    Past Medical History:  Diagnosis Date  . Bone lesion 02/07/2015  . 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 DDD (degenerative disc disease), cervical   . DJD (degenerative joint disease) of cervical spine   . Endometriosis   . Vitamin D deficiency     Past Surgical History:  Procedure Laterality Date  . ABDOMINAL HYSTERECTOMY    . CHOLECYSTECTOMY  2007  . HERNIA REPAIR     age 65  . NASAL SINUS SURGERY     age 89  . OOPHORECTOMY    . PARTIAL HYSTERECTOMY     1 ovary remaining    Current Outpatient Medications on File Prior to Visit  Medication Sig Dispense Refill  . albuterol (PROVENTIL HFA;VENTOLIN HFA) 108 (90 Base) MCG/ACT inhaler Inhale 2 puffs into the lungs every 6 (six) hours as needed for wheezing  or shortness of breath. 1 Inhaler 1  . Naproxen Sodium (ALEVE) 220 MG CAPS Take 1-2 capsule by mouth three times a day as needed for pain.    . pantoprazole (PROTONIX) 20 MG tablet Take by mouth.    . Vitamin D, Ergocalciferol, (DRISDOL) 1.25 MG (50000 UT) CAPS capsule Take 1 capsule (50,000 Units total) by mouth every 7 (seven) days. 12 capsule 0  . zolpidem (AMBIEN) 10 MG tablet Take 1 tablet (10 mg total) by mouth as needed. TAKE 1 TABLET BY MOUTH EVERY DAY 30 tablet 3  . hydroxychloroquine (PLAQUENIL) 200 MG tablet Take 200 mg by mouth as needed.     . traZODone (DESYREL) 50 MG tablet TAKE 1 TABLET (50 MG TOTAL) BY MOUTH AT BEDTIME AS NEEDED FOR SLEEP. (Patient not taking: Reported on 02/28/2019) 90 tablet 2   No current facility-administered medications on file prior to visit.    Allergies  Allergen Reactions  . Metronidazole Other (See Comments)  . Clindamycin Hcl Rash  . Ketoconazole Rash    Social History:  reports that she has never smoked. She has never used smokeless tobacco. She reports that she does not drink alcohol or use drugs.  Family History  Problem Relation Age of Onset  . Heart attack Father   . Diabetes Father   .  Diabetes Maternal Aunt   . Diabetes Maternal Uncle   . Diabetes Paternal Uncle   . Diabetes Maternal Grandmother   . Diabetes Paternal Grandmother   . Cancer Neg Hx   . Ovarian cancer Neg Hx     The following portions of the patient's history were reviewed and updated as appropriate: allergies, current medications, past family history, past medical history, past social history, past surgical history and problem list.  Review of Systems Pertinent items noted in HPI and remainder of comprehensive ROS otherwise negative.  Physical Exam:  BP (!) 85/65   Pulse (!) 104   Ht 5\' 8"  (1.727 m)   Wt 151 lb 4 oz (68.6 kg)   BMI 23.00 kg/m  CONSTITUTIONAL: Well-developed, well-nourished female in no acute distress.  HENT:  Normocephalic, atraumatic,  External right and left ear normal. Oropharynx is clear and moist EYES: Conjunctivae and EOM are normal. Pupils are equal, round, and reactive to light. No scleral icterus.  NECK: Normal range of motion, supple, no masses.  Normal thyroid.  SKIN: Skin is warm and dry. No rash noted. Not diaphoretic. No erythema. No pallor. MUSCULOSKELETAL: Normal range of motion. No tenderness.  No cyanosis, clubbing, or edema.  2+ distal pulses. NEUROLOGIC: Alert and oriented to person, place, and time. Normal reflexes, muscle tone coordination.  PSYCHIATRIC: Normal mood and affect. Normal behavior. Normal judgment and thought content. CARDIOVASCULAR: Normal heart rate noted, regular rhythm RESPIRATORY: Clear to auscultation bilaterally. Effort and breath sounds normal, no problems with respiration noted. BREASTS: Symmetric in size. No masses, tenderness, skin changes, nipple drainage, or lymphadenopathy bilaterally. ABDOMEN: Soft, no distention noted.  No tenderness, rebound or guarding.  PELVIC: Normal appearing external genitalia and urethral meatus; normal appearing vaginal mucosa with normal menopausal changes.  No abnormal discharge noted.  .  Normal uterine size, no other palpable masses, no uterine or adnexal tenderness.   Assessment and Plan:  Annual Well Women GYN Exam Pap smear not indicated Mammogram scheduled Colonoscopy ordered  Labs; Lipid, VIT D, TSH, Hem A 1 C, Sed Rate, ANA, IFA RA  Refill: Ambien Routine preventative health maintenance measures emphasized. Please refer to After Visit Summary for other counseling recommendations.      Philip Aspen, CNM

## 2019-03-02 ENCOUNTER — Telehealth: Payer: Self-pay

## 2019-03-02 LAB — LIPID PANEL
Chol/HDL Ratio: 4.9 ratio — ABNORMAL HIGH (ref 0.0–4.4)
Cholesterol, Total: 253 mg/dL — ABNORMAL HIGH (ref 100–199)
HDL: 52 mg/dL (ref >39)
LDL Chol Calc (NIH): 159 mg/dL — ABNORMAL HIGH (ref 0–99)
Triglycerides: 228 mg/dL — ABNORMAL HIGH (ref 0–149)
VLDL Cholesterol Cal: 42 mg/dL — ABNORMAL HIGH (ref 5–40)

## 2019-03-02 LAB — HEMOGLOBIN A1C
Est. average glucose Bld gHb Est-mCnc: 103 mg/dL
Hgb A1c MFr Bld: 5.2 % (ref 4.8–5.6)

## 2019-03-02 LAB — THYROID PANEL WITH TSH
Free Thyroxine Index: 1.7 (ref 1.2–4.9)
T3 Uptake Ratio: 19 % — ABNORMAL LOW (ref 24–39)
T4, Total: 8.7 ug/dL (ref 4.5–12.0)
TSH: 1.89 u[IU]/mL (ref 0.450–4.500)

## 2019-03-02 LAB — SEDIMENTATION RATE: Sed Rate: 29 mm/hr (ref 0–40)

## 2019-03-02 LAB — ANA,IFA RA DIAG PNL W/RFLX TIT/PATN
ANA Titer 1: NEGATIVE
Cyclic Citrullin Peptide Ab: 138 units — ABNORMAL HIGH (ref 0–19)
Rheumatoid fact SerPl-aCnc: <10 IU/mL (ref 0.0–13.9)

## 2019-03-02 LAB — VITAMIN D 25 HYDROXY (VIT D DEFICIENCY, FRACTURES): Vit D, 25-Hydroxy: 9 ng/mL — ABNORMAL LOW (ref 30.0–100.0)

## 2019-03-02 NOTE — Telephone Encounter (Signed)
Informed patient of lab results per AT. She declined diet information for cholesterol. She will find a PCP to follow.

## 2019-03-13 ENCOUNTER — Encounter: Payer: Self-pay | Admitting: *Deleted

## 2019-05-09 ENCOUNTER — Encounter: Payer: Self-pay | Admitting: Family Medicine

## 2019-05-09 ENCOUNTER — Other Ambulatory Visit: Payer: Self-pay

## 2019-05-09 ENCOUNTER — Ambulatory Visit: Payer: Self-pay | Admitting: *Deleted

## 2019-05-09 ENCOUNTER — Ambulatory Visit (INDEPENDENT_AMBULATORY_CARE_PROVIDER_SITE_OTHER): Payer: BC Managed Care – PPO | Admitting: Family Medicine

## 2019-05-09 ENCOUNTER — Telehealth: Payer: Self-pay

## 2019-05-09 VITALS — BP 118/74 | HR 83 | Temp 97.2°F | Resp 16 | Ht 68.0 in | Wt 151.7 lb

## 2019-05-09 DIAGNOSIS — M069 Rheumatoid arthritis, unspecified: Secondary | ICD-10-CM | POA: Diagnosis not present

## 2019-05-09 DIAGNOSIS — K769 Liver disease, unspecified: Secondary | ICD-10-CM

## 2019-05-09 DIAGNOSIS — R002 Palpitations: Secondary | ICD-10-CM | POA: Diagnosis not present

## 2019-05-09 DIAGNOSIS — E559 Vitamin D deficiency, unspecified: Secondary | ICD-10-CM

## 2019-05-09 DIAGNOSIS — D1809 Hemangioma of other sites: Secondary | ICD-10-CM

## 2019-05-09 DIAGNOSIS — R1013 Epigastric pain: Secondary | ICD-10-CM | POA: Diagnosis not present

## 2019-05-09 MED ORDER — OMEPRAZOLE 40 MG PO CPDR: 40.0000 mg | DELAYED_RELEASE_CAPSULE | Freq: Every day | ORAL | 2 refills | Status: DC

## 2019-05-09 NOTE — Telephone Encounter (Signed)
Patient reporting episodes of heart fluttering that last only seconds on/off over the last week. Episode last night lasted longer than previously. Denies CP/sweating/SOB/dizziness. No ankle swelling-has occasional hand swelling.Notices mostly 30 minutes after eating. Occurred last year she saw [facility] and was given a GI Cocktail and prescribed protonix which she is not taking. Reports her resting heart rate has been around 100 bpm at night, lately. Now 104 bpm while resting. No B/P to report.Care Advice including take the protonix today. Reviewed urgent symptoms to call back with including CP dizziness SOB. Appointment today @ 11:20 am with Dr. Ancil Boozer. Reason for Disposition . [1] Skipped or extra beat(s) AND [2] occurs 4 or more times per minute  Answer Assessment - Initial Assessment Questions 1. DESCRIPTION: "Please describe your heart rate or heart beat that you are having" (e.g., fast/slow, regular/irregular, skipped or extra beats, "palpitations")     Resting heart rate at 100 bpm. Heart fluttering for a few seconds 2. ONSET: "When did it start?" (Minutes, hours or days)      About one week ago. 3. DURATION: "How long does it last" (e.g., seconds, minutes, hours)    seconds 4. PATTERN "Does it come and go, or has it been constant since it started?"  "Does it get worse with exertion?"   "Are you feeling it now?"Feeling fine this morning.     On and off fluttering not related to exertion.  5. TAP: "Using your hand, can you tap out what you are feeling on a chair or table in front of you, so that I can hear?" (Note: not all patients can do this)        6. HEART RATE: "Can you tell me your heart rate?" "How many beats in 15 seconds?"  (Note: not all patients can do this)       HR 104 bpm while resting this morning. 7. RECURRENT SYMPTOM: "Have you ever had this before?" If so, ask: "When was the last time?" and "What happened that time?"      Yes, end of 2020 8. CAUSE: "What do you  think is causing the palpitations?"     Possible stomach issues 9. CARDIAC HISTORY: "Do you have any history of heart disease?" (e.g., heart attack, angina, bypass surgery, angioplasty, arrhythmia)     High cholesterol-not taking medicine 10. OTHER SYMPTOMS: "Do you have any other symptoms?" (e.g., dizziness, chest pain, sweating, difficulty breathing)       None of these headache sometimes 11. PREGNANCY: "Is there any chance you are pregnant?" "When was your last menstrual period?"       no  Protocols used: HEART RATE AND HEARTBEAT QUESTIONS-A-AH

## 2019-05-09 NOTE — Progress Notes (Signed)
Name: Kimberly Cantrell   MRN: [id number]    DOB: 11-Jun-1965   Date:05/09/2019       Progress Note  Subjective  Chief Complaint  Chief Complaint  Patient presents with  . Heart Flutters    1 week- resting heart rate is staying around 100, left upper quandrant pain after eating. Went to [facility] gave her a GI cocktail and claimed her symptoms down  . Bloated    Feels like she is fighting something internal-has had GI    HPI  LUQ pain/bloating: she went to [facility] in December and was given a GI cocktail and symptoms resolved . She states that she did well for months but now is back again. She noticed pain returned about 10 days ago. She felt some nausea, and indigestion, symptoms worse 30 minutes after meals.   Heart flutter: she has noticed some fluttering sensation over the past week, on left side of chest, also radiating to esophageal area. She states it woke her up last night and lasted a couple of hours. She also has a headache that lasts a few seconds with the flutters  RA: she states seems to be flaring over the past week , hands are aching. She has not been taking Naproxen because she is worried about her stomach.   Liver lesion: found on CT of abdomen done in 2016, discussed doing MRI and she is willing, she also has hemangioma of spine. Seen by Dr. Jeb Levering for that  Patient Active Problem List   Diagnosis Date Noted  . RA (rheumatoid arthritis) ([place]) 07/19/2018  . Mild intermittent asthma 05/10/2018  . Wheezing 05/10/2018  . 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 Other insomnia 03/06/2018  . Vitamin D deficiency 03/03/2018  . Mixed hyperlipidemia 03/03/2018  . Joint stiffness 03/03/2018  . Arthralgia 03/03/2018  . Spinal hemangioma 03/03/2018  . Blurred vision 03/03/2018  . Vaginal atrophy 10/27/2016  . Perimenopausal vasomotor symptoms 10/27/2016  . Well woman exam with routine gynecological exam 10/07/2015  . Endometriosis 10/07/2015  .  Bone lesion 02/07/2015    Past Surgical History:  Procedure Laterality Date  . ABDOMINAL HYSTERECTOMY    . CHOLECYSTECTOMY  2007  . HERNIA REPAIR     age 9  . NASAL SINUS SURGERY     age 80  . OOPHORECTOMY    . PARTIAL HYSTERECTOMY     1 ovary remaining    Family History  Problem Relation Age of Onset  . Heart attack Father   . Diabetes Father   . Diabetes Maternal Aunt   . Diabetes Maternal Uncle   . Diabetes Paternal Uncle   . Diabetes Maternal Grandmother   . Diabetes Paternal Grandmother   . Cancer Neg Hx   . Ovarian cancer Neg Hx     Social History   Tobacco Use  . Smoking status: Never Smoker  . Smokeless tobacco: Never Used  Substance Use Topics  . Alcohol use: No    Alcohol/week: 0.0 standard drinks     Current Outpatient Medications:  .  albuterol (PROVENTIL HFA;VENTOLIN HFA) 108 (90 Base) MCG/ACT inhaler, Inhale 2 puffs into the lungs every 6 (six) hours as needed for wheezing or shortness of breath., Disp: 1 Inhaler, Rfl: 1 .  hydroxychloroquine (PLAQUENIL) 200 MG tablet, Take 200 mg by mouth as needed. , Disp: , Rfl:  .  Naproxen Sodium (ALEVE) 220 MG CAPS, Take 1-2 capsule by mouth three times a day as needed for pain., Disp: ,  Rfl:  .  zolpidem (AMBIEN) 10 MG tablet, Take 1 tablet (10 mg total) by mouth at bedtime as needed. TAKE 1 TABLET BY MOUTH EVERY DAY, Disp: 30 tablet, Rfl: 3 .  pantoprazole (PROTONIX) 20 MG tablet, Take by mouth., Disp: , Rfl:  .  Vitamin D, Ergocalciferol, (DRISDOL) 1.25 MG (50000 UT) CAPS capsule, Take 1 capsule (50,000 Units total) by mouth every 7 (seven) days. (Patient not taking: Reported on 05/09/2019), Disp: 12 capsule, Rfl: 0  Allergies  Allergen Reactions  . Metronidazole Other (See Comments)  . Clindamycin Hcl Rash  . Ketoconazole Rash    I personally reviewed active problem list, medication list, allergies, family history, social history with the patient/caregiver today.   ROS  Constitutional: Negative for  fever or weight change.  Respiratory: Negative for cough and shortness of breath.   Cardiovascular: Negative for chest pain or palpitations.  Gastrointestinal: Negative for abdominal pain, no bowel changes.  Musculoskeletal: Negative for gait problem or joint swelling.  Skin: Negative for rash.  Neurological: Negative for dizziness or headache.  No other specific complaints in a complete review of systems (except as listed in HPI above). Objective  Vitals:   05/09/19 1144  BP: 118/74  Pulse: 83  Resp: 16  Temp: (!) 97.2 F (36.2 C)  TempSrc: Temporal  SpO2: 96%  Weight: 151 lb 11.2 oz (68.8 kg)  Height: '5\' 8"'$  (1.727 m)    Body mass index is 23.07 kg/m.  Physical Exam  Constitutional: Patient appears well-developed and well-nourished.  No distress.  HEENT: head atraumatic, normocephalic, pupils equal and reactive to light Cardiovascular: Normal rate, regular rhythm and normal heart sounds.  No murmur heard. No BLE edema. Pulmonary/Chest: Effort normal and breath sounds normal. No respiratory distress. Abdominal: Soft.  There is no tenderness. Psychiatric: Patient has a normal mood and affect. behavior is normal. Judgment and thought content normal.  Recent Results (from the past 2160 hour(s))  ANA,IFA RA Diag Pnl w/rflx Tit/Patn     Status: Abnormal   Collection Time: 02/28/19  9:55 AM  Result Value Ref Range   ANA Titer 1 Negative     Comment:                                      Negative   <1:80                                      Borderline  1:80                                      Positive   >1:80    Rhuematoid fact SerPl-aCnc <10.0 0.0 - 49.7 IU/mL   Cyclic Citrullin Peptide Ab 138 (H) 0 - 19 units    Comment:                           Negative               <20                           Weak positive      20 - 39  Moderate positive  40 - 59                           Strong positive        >59   Vitamin D (25 hydroxy)     Status:  Abnormal   Collection Time: 02/28/19  9:55 AM  Result Value Ref Range   Vit D, 25-Hydroxy 9.0 (L) 30.0 - 100.0 ng/mL    Comment: Vitamin D deficiency has been defined by the [place] practice guideline as a level of serum 25-OH vitamin D less than 20 ng/mL (1,2). The Endocrine Society went on to further define vitamin D insufficiency as a level between 21 and 29 ng/mL (2). 1. IOM (Institute of Medicine). 2010. Dietary reference    intakes for calcium and D. [place]: The    [employer]. 2. Holick MF, Binkley [place], Bischoff-Ferrari HA, et al.    Evaluation, treatment, and prevention of vitamin D    deficiency: an Endocrine Society clinical practice    guideline. JCEM. 2011 Jul; 96(7):1911-30.   Thyroid Panel With TSH     Status: Abnormal   Collection Time: 02/28/19  9:55 AM  Result Value Ref Range   TSH 1.890 0.450 - 4.500 uIU/mL   T4, Total 8.7 4.5 - 12.0 ug/dL   T3 Uptake Ratio 19 (L) 24 - 39 %   Free Thyroxine Index 1.7 1.2 - 4.9  Hemoglobin A1C     Status: None   Collection Time: 02/28/19  9:55 AM  Result Value Ref Range   Hgb A1c MFr Bld 5.2 4.8 - 5.6 %    Comment:          Prediabetes: 5.7 - 6.4          Diabetes: >6.4          Glycemic control for adults with diabetes: <7.0    Est. average glucose Bld gHb Est-mCnc 103 mg/dL  Lipid Profile     Status: Abnormal   Collection Time: 02/28/19  9:55 AM  Result Value Ref Range   Cholesterol, Total 253 (H) 100 - 199 mg/dL   Triglycerides 228 (H) 0 - 149 mg/dL   HDL 52 >39 mg/dL   VLDL Cholesterol Cal 42 (H) 5 - 40 mg/dL   LDL Chol Calc (NIH) 159 (H) 0 - 99 mg/dL   Chol/HDL Ratio 4.9 (H) 0.0 - 4.4 ratio    Comment:                                   T. Chol/HDL Ratio                                             Men  Women                               1/2 Avg.Risk  3.4    3.3                                   Avg.Risk  5.0    4.4  2X Avg.Risk   9.6    7.1                                3X Avg.Risk 23.4   11.0   Sed Rate (ESR)     Status: None   Collection Time: 02/28/19  9:55 AM  Result Value Ref Range   Sed Rate 29 0 - 40 mm/hr    PHQ2/9: Depression screen [facility] 2/9 07/19/2018 05/10/2018 04/06/2018 03/03/2018  Decreased Interest 0 0 0 0  Down, Depressed, Hopeless 0 0 0 0  PHQ - 2 Score 0 0 0 0  Altered sleeping 0 0 0 -  Tired, decreased energy 0 0 0 -  Change in appetite 0 0 0 -  Feeling bad or failure about yourself  0 0 0 -  Trouble concentrating 0 0 0 -  Moving slowly or fidgety/restless 0 0 0 -  Suicidal thoughts 0 0 0 -  PHQ-9 Score 0 0 0 -  Difficult doing work/chores Not difficult at all Not difficult at all Not difficult at all -    phq 9 is negative   Fall Risk: Fall Risk  07/19/2018 05/10/2018 04/06/2018 03/03/2018  Falls in the past year? 0 0 0 0  Number falls in past yr: - - 0 0  Injury with Fall? - - 0 0      Assessment & Plan  1. Palpitation  - CBC with Differential/Platelet - TSH - Ambulatory referral to Cardiology  2. Dyspepsia  - COMPLETE METABOLIC PANEL WITH GFR - H. pylori breath test - omeprazole (PRILOSEC) 40 MG capsule; Take 1 capsule (40 mg total) by mouth daily.  Dispense: 30 capsule; Refill: 2  3. Rheumatoid arthritis involving multiple sites, unspecified whether rheumatoid factor present (HCC)  - C-reactive protein - Sedimentation rate  4. Vitamin D deficiency  Continue supplementation   5. Liver lesion  - MR LIVER W WO CONTRAST; Future  6. Hemangioma of spine  - MR LIVER W WO CONTRAST; Future

## 2019-05-09 NOTE — Telephone Encounter (Signed)
I contacted this patient to inform her that she has been scheduled to have her MRI on Sunday, May 20, 2019 at 12:20pm at [facility], but there was no answer and her voicemail is full.   If patient calls back please let her know the above listed information and that she is not to have anything to eat or drink 4 hrs prior to her scheduled imaging. Also she is  to arrive by 11:30am at the [facility] entrance.  If this appointment date and time is not good for her then she is to call centralized scheduling at [phone] so that they can find a time slot that is more appropriate.  CRM will be placed.

## 2019-05-10 ENCOUNTER — Other Ambulatory Visit: Payer: Self-pay | Admitting: Family Medicine

## 2019-05-10 DIAGNOSIS — M899 Disorder of bone, unspecified: Secondary | ICD-10-CM

## 2019-05-10 LAB — CBC WITH DIFFERENTIAL/PLATELET
Absolute Monocytes: 514 cells/uL (ref 200–950)
Basophils Absolute: 64 cells/uL (ref 0–200)
Basophils Relative: 0.6 %
Eosinophils Absolute: 96 cells/uL (ref 15–500)
Eosinophils Relative: 0.9 %
HCT: 42.2 % (ref 35.0–45.0)
Hemoglobin: 14.2 g/dL (ref 11.7–15.5)
Lymphs Abs: 2825 cells/uL (ref 850–3900)
MCH: 31.2 pg (ref 27.0–33.0)
MCHC: 33.6 g/dL (ref 32.0–36.0)
MCV: 92.7 fL (ref 80.0–100.0)
MPV: 10.6 fL (ref 7.5–12.5)
Monocytes Relative: 4.8 %
Neutro Abs: 7201 cells/uL (ref 1500–7800)
Neutrophils Relative %: 67.3 %
Platelets: 324 10*3/uL (ref 140–400)
RBC: 4.55 10*6/uL (ref 3.80–5.10)
RDW: 12.5 % (ref 11.0–15.0)
Total Lymphocyte: 26.4 %
WBC: 10.7 10*3/uL (ref 3.8–10.8)

## 2019-05-10 LAB — COMPLETE METABOLIC PANEL WITH GFR
AG Ratio: 1.9 (calc) (ref 1.0–2.5)
ALT: 13 U/L (ref 6–29)
AST: 15 U/L (ref 10–35)
Albumin: 4.7 g/dL (ref 3.6–5.1)
Alkaline phosphatase (APISO): 112 U/L (ref 37–153)
BUN: 11 mg/dL (ref 7–25)
CO2: 25 mmol/L (ref 20–32)
Calcium: 9.7 mg/dL (ref 8.6–10.4)
Chloride: 105 mmol/L (ref 98–110)
Creat: 0.78 mg/dL (ref 0.50–1.05)
GFR, Est African American: 101 mL/min/{1.73_m2} (ref >=60)
GFR, Est Non African American: 87 mL/min/{1.73_m2} (ref >=60)
Globulin: 2.5 g/dL (calc) (ref 1.9–3.7)
Glucose, Bld: 85 mg/dL (ref 65–99)
Potassium: 3.8 mmol/L (ref 3.5–5.3)
Sodium: 141 mmol/L (ref 135–146)
Total Bilirubin: 0.5 mg/dL (ref 0.2–1.2)
Total Protein: 7.2 g/dL (ref 6.1–8.1)

## 2019-05-10 LAB — SEDIMENTATION RATE: Sed Rate: 14 mm/h (ref 0–30)

## 2019-05-10 LAB — H. PYLORI BREATH TEST: H. pylori Breath Test: NOT DETECTED

## 2019-05-10 LAB — TSH: TSH: 2.14 mIU/L

## 2019-05-10 LAB — C-REACTIVE PROTEIN: CRP: 13.8 mg/L — ABNORMAL HIGH (ref <8.0)

## 2019-05-10 NOTE — Telephone Encounter (Signed)
Spoke with radiologist and they suggest doing two separate imaging on of her liver and one of her spine where the lesion is. Imaging can all depend on the technician and how well the images of her spine would be if it was ordered as a MRI of her Liver. But to get a more accurate picture of the lesion order another MRI of that specific area.

## 2019-05-20 ENCOUNTER — Ambulatory Visit: Payer: BC Managed Care – PPO

## 2019-07-02 ENCOUNTER — Other Ambulatory Visit: Payer: Self-pay

## 2019-07-09 ENCOUNTER — Ambulatory Visit: Payer: BC Managed Care – PPO | Admitting: Gastroenterology

## 2019-07-19 ENCOUNTER — Ambulatory Visit (INDEPENDENT_AMBULATORY_CARE_PROVIDER_SITE_OTHER): Payer: BC Managed Care – PPO | Admitting: Gastroenterology

## 2019-07-19 ENCOUNTER — Other Ambulatory Visit: Payer: Self-pay

## 2019-07-19 ENCOUNTER — Encounter: Payer: Self-pay | Admitting: Gastroenterology

## 2019-07-19 DIAGNOSIS — R109 Unspecified abdominal pain: Secondary | ICD-10-CM | POA: Diagnosis not present

## 2019-07-19 DIAGNOSIS — R1013 Epigastric pain: Secondary | ICD-10-CM | POA: Diagnosis not present

## 2019-07-19 NOTE — Progress Notes (Signed)
Gastroenterology Consultation  Referring Provider:     Arnetha Courser, MD Primary Care Physician:  Arnetha Courser, MD Primary Gastroenterologist:  Dr. Allen Norris     Reason for Consultation:     GERD        HPI:   Kimberly Cantrell is a 54 y.o. y/o female referred for consultation & management of GERD by Dr. Sanda Klein, Satira Anis, MD.  This patient comes in today with a history of seeing me back in 2009.  The patient then followed up with Dr. Vira Agar in 2011 and subsequently has been seen by their office up until 2017.  The patient had a colonoscopy in 2013.  The patient had a hyperplastic polyp at that time.  The patient had imaging in 2017 with a description of a liver lesion showing:  hypervascular lesions identified within the liver parenchyma. These are likely [place], but MRI of the abdomen without and with contrast is recommended to confirm. This follow-up MRI should be performed with Eovist contrast material  The patient has subsequently been set up for a future MRI.  The patient was seen in urgent care in December 2020 with heart fluttering and was treated with a GI cocktail that alleviated her symptoms.  The patient has been doing well until the beginning of March when she started to have similar symptoms.  The patient had been having palpitations with the left sided abdominal pain.  She also states that her joints were hurting her and she has recently seen her rheumatologist for this.  She now reports that her symptoms have completely resolved without any further palpitations or belly pain.  The patient also reports that she had stopped her omeprazole.  There is no report of any black stools or bloody stools.  She denies any nausea or vomiting.  The patient has also been recommended to have an upper endoscopy by her primary care provider for some intermittent trouble swallowing.  Past Medical History:  Diagnosis Date  . Bone lesion 02/07/2015  . Cyclic citrullinated peptide (CCP) antibody  positive 04/09/2018   Feb 2020  . DDD (degenerative disc disease), cervical   . DJD (degenerative joint disease) of cervical spine   . Endometriosis   . Vitamin D deficiency     Past Surgical History:  Procedure Laterality Date  . ABDOMINAL HYSTERECTOMY    . CHOLECYSTECTOMY  2007  . HERNIA REPAIR     age 78  . NASAL SINUS SURGERY     age 60  . OOPHORECTOMY    . PARTIAL HYSTERECTOMY     1 ovary remaining    Prior to Admission medications   Medication Sig Start Date End Date Taking? Authorizing Provider  albuterol (PROVENTIL HFA;VENTOLIN HFA) 108 (90 Base) MCG/ACT inhaler Inhale 2 puffs into the lungs every 6 (six) hours as needed for wheezing or shortness of breath. 05/10/18   Poulose, Bethel Born, NP  hydroxychloroquine (PLAQUENIL) 200 MG tablet Take 200 mg by mouth as needed.  04/18/18   Behalal-Bock, Christele, MD  Naproxen Sodium (ALEVE) 220 MG CAPS Take 1-2 capsule by mouth three times a day as needed for pain.    [provider]  omeprazole (PRILOSEC) 40 MG capsule Take 1 capsule (40 mg total) by mouth daily. 05/09/19   Steele Sizer, MD  pantoprazole (PROTONIX) 20 MG tablet Take by mouth. 02/12/19 03/04/19  [provider]  Vitamin D, Ergocalciferol, (DRISDOL) 1.25 MG (50000 UT) CAPS capsule Take 1 capsule (50,000 Units total) by mouth  every 7 (seven) days. Patient not taking: Reported on 05/09/2019 03/03/18   Hubbard Hartshorn, FNP  zolpidem (AMBIEN) 10 MG tablet Take 1 tablet (10 mg total) by mouth at bedtime as needed. TAKE 1 TABLET BY MOUTH EVERY DAY 02/28/19   Philip Aspen, CNM    Family History  Problem Relation Age of Onset  . Heart attack Father   . Diabetes Father   . Diabetes Maternal Aunt   . Diabetes Maternal Uncle   . Diabetes Paternal Uncle   . Diabetes Maternal Grandmother   . Diabetes Paternal Grandmother   . Cancer Neg Hx   . Ovarian cancer Neg Hx      Social History   Tobacco Use  . Smoking status: Never Smoker  . Smokeless tobacco:  Never Used  Substance Use Topics  . Alcohol use: No    Alcohol/week: 0.0 standard drinks  . Drug use: No    Allergies as of 07/19/2019 - Review Complete 05/09/2019  Allergen Reaction Noted  . Metronidazole Other (See Comments) 12/09/2014  . Clindamycin hcl Rash 12/09/2014  . Ketoconazole Rash 12/09/2014    Review of Systems:    All systems reviewed and negative except where noted in HPI.   Physical Exam:  There were no vitals taken for this visit. No LMP recorded. Patient has had a hysterectomy. General:   Alert,  Well-developed, well-nourished, pleasant and cooperative in NAD Head:  Normocephalic and atraumatic. Eyes:  Sclera clear, no icterus.   Conjunctiva pink. Ears:  Normal auditory acuity. Neck:  Supple; no masses or thyromegaly. Lungs:  Respirations even and unlabored.  Clear throughout to auscultation.   No wheezes, crackles, or rhonchi. No acute distress. Heart:  Regular rate and rhythm; no murmurs, clicks, rubs, or gallops. Abdomen:  Normal bowel sounds.  No bruits.  Soft, non-tender and non-distended without masses, hepatosplenomegaly or hernias noted.  No guarding or rebound tenderness.  Negative Carnett sign.   Rectal:  Deferred.  Pulses:  Normal pulses noted. Extremities:  No clubbing or edema.  No cyanosis. Neurologic:  Alert and oriented x3;  grossly normal neurologically. Skin:  Intact without significant lesions or rashes.  No jaundice. Lymph Nodes:  No significant cervical adenopathy. Psych:  Alert and cooperative. Normal mood and affect.  Imaging Studies: No results found.  Assessment and Plan:   Kimberly Cantrell is a 54 y.o. y/o female who had a episode of left upper abdominal pain that has resolved.  The patient may have been suffering from gastritis or peptic ulcer disease but now is pain-free.  These symptoms were associated with palpitations which may have been pain induced.  With the patient's abdominal pain and some intermittent dysphagia she will  be set up for a upper endoscopy.  The patient also is due for screening colonoscopy and will be set up for screening colonoscopy at the same time.  The patient has been explained the risks and agrees with it.    Lucilla Lame, MD. Marval Regal    Note: This dictation was prepared with Dragon dictation along with smaller phrase technology. Any transcriptional errors that result from this process are unintentional.

## 2019-07-23 ENCOUNTER — Other Ambulatory Visit: Payer: Self-pay

## 2019-07-23 DIAGNOSIS — R1013 Epigastric pain: Secondary | ICD-10-CM

## 2019-07-23 DIAGNOSIS — Z1211 Encounter for screening for malignant neoplasm of colon: Secondary | ICD-10-CM

## 2019-08-10 ENCOUNTER — Ambulatory Visit (INDEPENDENT_AMBULATORY_CARE_PROVIDER_SITE_OTHER): Payer: BC Managed Care – PPO | Admitting: Family Medicine

## 2019-08-10 ENCOUNTER — Encounter: Payer: Self-pay | Admitting: Family Medicine

## 2019-08-10 ENCOUNTER — Other Ambulatory Visit: Payer: Self-pay

## 2019-08-10 VITALS — BP 122/76 | HR 95 | Temp 98.1°F | Resp 14 | Ht 68.0 in | Wt 152.1 lb

## 2019-08-10 DIAGNOSIS — D1809 Hemangioma of other sites: Secondary | ICD-10-CM

## 2019-08-10 DIAGNOSIS — R002 Palpitations: Secondary | ICD-10-CM | POA: Diagnosis not present

## 2019-08-10 DIAGNOSIS — R1310 Dysphagia, unspecified: Secondary | ICD-10-CM

## 2019-08-10 DIAGNOSIS — M069 Rheumatoid arthritis, unspecified: Secondary | ICD-10-CM

## 2019-08-10 DIAGNOSIS — M5137 Other intervertebral disc degeneration, lumbosacral region: Secondary | ICD-10-CM

## 2019-08-10 DIAGNOSIS — G4709 Other insomnia: Secondary | ICD-10-CM | POA: Diagnosis not present

## 2019-08-10 DIAGNOSIS — M5030 Other cervical disc degeneration, unspecified cervical region: Secondary | ICD-10-CM

## 2019-08-10 DIAGNOSIS — R1319 Other dysphagia: Secondary | ICD-10-CM

## 2019-08-10 NOTE — Progress Notes (Signed)
Name: Kimberly Cantrell   MRN: [id number]    DOB: [birth year]   Date:08/10/2019       Progress Note  Subjective  Chief Complaint  Chief Complaint  Patient presents with  . Follow-up    palpitations resolved/gastric better    HPI  Palpitation: she states palpitation that started suddenly on Good Friday resolved when she went on vacation and spent 3 hours at a church in [place] and symptoms resolved. She has a cardiologist - Saw PA from Dr. Etta Quill office she had a holter monitor for 72 hours, but echo is still pending . She states no problems since.   Dysphagia: she will have EGD and colonoscopy done 09/2019 , she states epigastric pain not as severe since she stopped taking Naproxen   Hemangioma  RA: she states last flare was this past Spring, when she had palpitation. She states she had bilateral hand soreness, erythema and stiffness. She states pain is not as intense but still has stiffness at night , he advised to at least take half every day or one every other day. She still has back pain, hip pain  Insomnia: takes Ambien to stay asleep since her back always wakes up up all night. Getting it from [facility]   Chronic lower back pain: she was seen by PA Eliberto Ivory, she will start PT, and if she is considering having surgery if needed, has radiation on both buttocks. She was seen by Dr. Phyllis Ginger in the past for her neck, she would like to see Dr. Holley Raring to see if ablation would be helpful  Patient Active Problem List   Diagnosis Date Noted  . RA (rheumatoid arthritis) ([place]) 07/19/2018  . Mild intermittent asthma 05/10/2018  . Wheezing 05/10/2018  . 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 Other insomnia 03/06/2018  . Vitamin D deficiency 03/03/2018  . Mixed hyperlipidemia 03/03/2018  . Joint stiffness 03/03/2018  . Arthralgia 03/03/2018  . Spinal hemangioma 03/03/2018  . Blurred vision 03/03/2018  . Vaginal atrophy 10/27/2016  . Perimenopausal vasomotor  symptoms 10/27/2016  . Well woman exam with routine gynecological exam 10/07/2015  . Endometriosis 10/07/2015  . Bone lesion 02/07/2015    Past Surgical History:  Procedure Laterality Date  . ABDOMINAL HYSTERECTOMY    . CHOLECYSTECTOMY  2007  . HERNIA REPAIR     age 27  . NASAL SINUS SURGERY     age 27  . OOPHORECTOMY    . PARTIAL HYSTERECTOMY     1 ovary remaining    Family History  Problem Relation Age of Onset  . Heart attack Father   . Diabetes Father   . Diabetes Maternal Aunt   . Diabetes Maternal Uncle   . Diabetes Paternal Uncle   . Diabetes Maternal Grandmother   . Diabetes Paternal Grandmother   . Cancer Neg Hx   . Ovarian cancer Neg Hx     Social History   Tobacco Use  . Smoking status: Never Smoker  . Smokeless tobacco: Never Used  Substance Use Topics  . Alcohol use: No    Alcohol/week: 0.0 standard drinks     Current Outpatient Medications:  .  albuterol (PROVENTIL HFA;VENTOLIN HFA) 108 (90 Base) MCG/ACT inhaler, Inhale 2 puffs into the lungs every 6 (six) hours as needed for wheezing or shortness of breath., Disp: 1 Inhaler, Rfl: 1 .  hydroxychloroquine (PLAQUENIL) 200 MG tablet, Take 200 mg by mouth as needed. , Disp: , Rfl:  .  zolpidem (AMBIEN) 10 MG tablet,  Take 1 tablet (10 mg total) by mouth at bedtime as needed. TAKE 1 TABLET BY MOUTH EVERY DAY, Disp: 30 tablet, Rfl: 3 .  Naproxen Sodium (ALEVE) 220 MG CAPS, Take 1-2 capsule by mouth three times a day as needed for pain. (Patient not taking: Reported on 08/10/2019), Disp: , Rfl:  .  omeprazole (PRILOSEC) 40 MG capsule, Take 1 capsule (40 mg total) by mouth daily. (Patient not taking: Reported on 08/10/2019), Disp: 30 capsule, Rfl: 2 .  pantoprazole (PROTONIX) 20 MG tablet, Take by mouth., Disp: , Rfl:  .  tiZANidine (ZANAFLEX) 4 MG tablet, Take by mouth. (Patient not taking: Reported on 08/10/2019), Disp: , Rfl:  .  Vitamin D, Ergocalciferol, (DRISDOL) 1.25 MG (50000 UT) CAPS capsule, Take 1  capsule (50,000 Units total) by mouth every 7 (seven) days. (Patient not taking: Reported on 05/09/2019), Disp: 12 capsule, Rfl: 0  Allergies  Allergen Reactions  . Metronidazole Other (See Comments)  . Clindamycin Hcl Rash  . Ketoconazole Rash    I personally reviewed active problem list, medication list, allergies, family history, social history, health maintenance with the patient/caregiver today.   ROS  Constitutional: Negative for fever or weight change.  Respiratory: Negative for cough and shortness of breath.   Cardiovascular: Negative for chest pain or palpitations.  Gastrointestinal: Negative for abdominal pain, no bowel changes.  Musculoskeletal: Negative for gait problem or joint swelling.  Skin: Negative for rash.  Neurological: Negative for dizziness or headache.  No other specific complaints in a complete review of systems (except as listed in HPI above).  Objective  Vitals:   08/10/19 1337  BP: 122/76  Pulse: 95  Resp: 14  Temp: 98.1 F (36.7 C)  TempSrc: Temporal  SpO2: 97%  Weight: 152 lb 1.6 oz (69 kg)  Height: 5\' 8"  (1.727 m)    Body mass index is 23.13 kg/m.  Physical Exam  Constitutional: Patient appears well-developed and well-nourished. No distress.  HEENT: head atraumatic, normocephalic, pupils equal and reactive to light,  neck supple Cardiovascular: Normal rate, regular rhythm and normal heart sounds.  No murmur heard. No BLE edema. Pulmonary/Chest: Effort normal and breath sounds normal. No respiratory distress. Abdominal: Soft.  There is no tenderness. Muscular Skeletal: no synovitis Psychiatric: Patient has a normal mood and affect. behavior is normal. Judgment and thought content normal.  PHQ2/9: Depression screen [facility] 2/9 08/10/2019 05/09/2019 07/19/2018 05/10/2018 04/06/2018  Decreased Interest 0 0 0 0 0  Down, Depressed, Hopeless 0 0 0 0 0  PHQ - 2 Score 0 0 0 0 0  Altered sleeping 0 0 0 0 0  Tired, decreased energy 0 0 0 0 0  Change  in appetite 0 0 0 0 0  Feeling bad or failure about yourself  0 0 0 0 0  Trouble concentrating 0 0 0 0 0  Moving slowly or fidgety/restless 0 0 0 0 0  Suicidal thoughts 0 0 0 0 0  PHQ-9 Score 0 0 0 0 0  Difficult doing work/chores - Not difficult at all Not difficult at all Not difficult at all Not difficult at all    phq 9 is negative   Fall Risk: Fall Risk  08/10/2019 05/09/2019 07/19/2018 05/10/2018 04/06/2018  Falls in the past year? 0 0 0 0 0  Number falls in past yr: 0 0 - - 0  Injury with Fall? 0 0 - - 0     Functional Status Survey: Is the patient deaf or have difficulty hearing?: No  Does the patient have difficulty seeing, even when wearing glasses/contacts?: No Does the patient have difficulty concentrating, remembering, or making decisions?: No Does the patient have difficulty walking or climbing stairs?: No Does the patient have difficulty dressing or bathing?: No Does the patient have difficulty doing errands alone such as visiting a doctor's office or shopping?: No    Assessment & Plan  1. Rheumatoid arthritis involving multiple sites, unspecified whether rheumatoid factor present [facility])  Keep follow up with Dr. Jefm Bryant, trying to get a rehab pool, since water therapy seems to control her symptoms   2. Palpitation  Resolved, seen by cardiologist   3. Hemangioma of spine  Due for repeat studies   4. Other insomnia  Taken  Ambien most night   5. Esophageal dysphagia  Seeing Dr. Durwin Reges   6. DDD (degenerative disc disease), cervical  - Ambulatory referral to Pain Clinic  7. DDD (degenerative disc disease), lumbosacral  - Ambulatory referral to Pain Clinic

## 2019-08-13 ENCOUNTER — Other Ambulatory Visit: Payer: Self-pay | Admitting: Family Medicine

## 2019-08-13 DIAGNOSIS — R1013 Epigastric pain: Secondary | ICD-10-CM

## 2019-08-27 ENCOUNTER — Other Ambulatory Visit: Payer: Self-pay | Admitting: Certified Nurse Midwife

## 2019-08-27 ENCOUNTER — Other Ambulatory Visit: Payer: Self-pay

## 2019-08-27 ENCOUNTER — Telehealth: Payer: Self-pay | Admitting: Certified Nurse Midwife

## 2019-08-27 ENCOUNTER — Telehealth: Payer: Self-pay

## 2019-08-27 MED ORDER — ZOLPIDEM TARTRATE 10 MG PO TABS: 10.0000 mg | ORAL_TABLET | Freq: Every evening | ORAL | 3 refills | Status: DC | PRN

## 2019-08-27 NOTE — Telephone Encounter (Signed)
Patient informed refill at preferred pharmacy.

## 2019-08-27 NOTE — Telephone Encounter (Signed)
Let her know that I put in the refill.   Thanks  Deneise Lever

## 2019-08-27 NOTE — Telephone Encounter (Signed)
Patient called in needing a refill on her Rx Ambien. Patient has already called pharmacy and they told her to call us. Informed patient I would send a message to provider however they have 24-48 hours to respond.

## 2019-09-20 ENCOUNTER — Ambulatory Visit: Payer: BC Managed Care – PPO | Admitting: Student in an Organized Health Care Education/Training Program

## 2019-10-11 ENCOUNTER — Telehealth: Payer: Self-pay | Admitting: Infectious Diseases

## 2019-10-11 NOTE — Telephone Encounter (Signed)
Pt called and stated she was advised to schedule an appt with [facility], there is no referral. Would you like me to schedule an appt or is a referral needed first? She did not state what she needed to be seen for

## 2019-10-12 NOTE — Telephone Encounter (Signed)
She would need a referral. All patients need referral and notes including labs for them reason being seen. I tried to call and her vm is full. I sent her a mychart activation text and emailed her stating we need to go over few things.

## 2019-10-26 ENCOUNTER — Encounter: Admission: RE | Payer: Self-pay | Source: Home / Self Care

## 2019-10-26 ENCOUNTER — Ambulatory Visit
Admission: RE | Admit: 2019-10-26 | Payer: BC Managed Care – PPO | Source: Home / Self Care | Admitting: Gastroenterology

## 2019-10-26 SURGERY — COLONOSCOPY WITH PROPOFOL
Anesthesia: Choice

## 2019-10-30 NOTE — Progress Notes (Signed)
Patient ID: Kimberly Cantrell, female    DOB: [birth year], 54 y.o.   MRN: [id number]  PCP: Steele Sizer, MD  Chief Complaint  Patient presents with  . Consult    regarding covid shot side effects    Subjective:   Kimberly Cantrell is a 54 y.o. female, presents to clinic with CC of the following:consult regarding side effects from Covid vaccine (1st shot).   Pt got Pfizer COVID vaccine - 1st on 10/12/2019 at [place] included:  In pts first 15 min observation - pt had hives to hands and feet -  at [facility] occupational health She was observed and was not treated with anything  On site and she went home and kept observing herself She developed a HA, muscle pain, runny nose, when she went to bed she was woken up with rapid heart rate - notes her pulse ox showed HR of 104 and that lasted for about an hour She has muscle twitches here and there, cramping in legs, that has continued for the past couple weeks ever since vaccination about 3 weeks ago  Pt previously contacted her rheumatologist regarding her vaccine concerns after employer required vaccination: 10/05/2019 phone call documented the following: "Patient states she is being requiring the vaccination or a letter from the doctor stating they cant have it by September. States she has some concerns as she has reactions from vaccines and just wants to make sure Dr. Jefm Bryant thinks its safe for her to get."  Response from Dr. Jefm Bryant:   "Telephone Encounter - Jefm Bryant, Carles Collet., MD - 10/07/2019 3:31 PM EDT Formatting of this note might be different from the original. My point of view regarding her arthritis and her recent illnesses and medication, she could take the vaccine. Only comment on her prior vaccine reactions as I have no documentation of those"  They advised f/up appt to discuss at f/up appt  She was referred to ID to further discuss  She has followed up about the IF referral and they suggested she consult with an  allergist     Prior RA per last PCP note RA - RA: she states last flare was this past Spring, when she had palpitation. She states she had bilateral hand soreness, erythema and stiffness. She states pain is not as intense but still has stiffness at night , he advised to at least take half every day or one every other day. She still has back pain, hip pain  Pt notes that she is worried her vaccine SE may be causing worsening RA sx/flares and more damage  She is scheduled for her next vaccine at [facility] in a few days - she does want to "get it over with" wanted to know what PCP recommended     Patient Active Problem List   Diagnosis Date Noted  . RA (rheumatoid arthritis) ([place]) 07/19/2018  . Mild intermittent asthma 05/10/2018  . Wheezing 05/10/2018  . CRP elevated 04/18/2018  . 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 Other insomnia 03/06/2018  . Vitamin D deficiency 03/03/2018  . Mixed hyperlipidemia 03/03/2018  . Joint stiffness 03/03/2018  . Arthralgia 03/03/2018  . Spinal hemangioma 03/03/2018  . Blurred vision 03/03/2018  . Vaginal atrophy 10/27/2016  . Perimenopausal vasomotor symptoms 10/27/2016  . Well woman exam with routine gynecological exam 10/07/2015  . Endometriosis 10/07/2015  . Bone lesion 02/07/2015      Current Outpatient Medications:  .  albuterol (PROVENTIL HFA;VENTOLIN HFA) 108 (90  Base) MCG/ACT inhaler, Inhale 2 puffs into the lungs every 6 (six) hours as needed for wheezing or shortness of breath., Disp: 1 Inhaler, Rfl: 1 .  hydroxychloroquine (PLAQUENIL) 200 MG tablet, Take 200 mg by mouth as needed. , Disp: , Rfl:  .  omeprazole (PRILOSEC) 40 MG capsule, TAKE 1 CAPSULE BY MOUTH EVERY DAY, Disp: 90 capsule, Rfl: 0 .  zolpidem (AMBIEN) 10 MG tablet, Take 1 tablet (10 mg total) by mouth at bedtime as needed. TAKE 1 TABLET BY MOUTH EVERY DAY, Disp: 30 tablet, Rfl: 3 .  pantoprazole (PROTONIX) 20 MG tablet, Take by mouth., Disp: ,  Rfl:  .  tiZANidine (ZANAFLEX) 4 MG tablet, Take by mouth. (Patient not taking: Reported on 08/10/2019), Disp: , Rfl:    Allergies  Allergen Reactions  . Metronidazole Other (See Comments)  . Clindamycin Hcl Rash  . Ketoconazole Rash     Social History   Tobacco Use  . Smoking status: Never Smoker  . Smokeless tobacco: Never Used  Vaping Use  . Vaping Use: Never used  Substance Use Topics  . Alcohol use: No    Alcohol/week: 0.0 standard drinks  . Drug use: No      Chart Review Today: I personally reviewed active problem list, medication list, allergies, family history, social history, health maintenance, notes from last encounter, lab results, imaging with the patient/caregiver today.   Review of Systems 10 Systems reviewed and are negative for acute change except as noted in the HPI.      Objective:   Vitals:   10/31/19 1047  BP: 120/76  Pulse: 100  Resp: 16  Temp: 97.6 F (36.4 C)  TempSrc: Oral  SpO2: 99%  Weight: 147 lb 9.6 oz (67 kg)  Height: 5\' 8"  (1.727 m)    Body mass index is 22.44 kg/m.  Physical Exam Vitals and nursing note reviewed.  Constitutional:      General: She is not in acute distress.    Appearance: Normal appearance. She is not ill-appearing, toxic-appearing or diaphoretic.  HENT:     Head: Normocephalic and atraumatic.     Right Ear: External ear normal.     Left Ear: External ear normal.     Nose: Nose normal. No congestion.     Mouth/Throat:     Mouth: Mucous membranes are moist.     Pharynx: Oropharynx is clear. Uvula midline. No pharyngeal swelling or posterior oropharyngeal erythema.     Tonsils: No tonsillar exudate.     Comments: Airway patent  Neck:     Trachea: Trachea and phonation normal.  Cardiovascular:     Rate and Rhythm: Normal rate and regular rhythm.     Pulses: Normal pulses.     Heart sounds: Normal heart sounds.  Pulmonary:     Effort: Pulmonary effort is normal.     Breath sounds: Normal breath  sounds.  Musculoskeletal:     Comments: No observed muscle fasciculations   Skin:    General: Skin is warm and dry.     Coloration: Skin is not jaundiced or pale.     Findings: No rash.  Neurological:     Mental Status: She is alert.     Coordination: Coordination normal.     Gait: Gait normal.  Psychiatric:        Mood and Affect: Mood normal.      Results for orders placed or performed in visit on 05/09/19  CBC with Differential/Platelet  Result Value Ref Range  WBC 10.7 3.8 - 10.8 Thousand/uL   RBC 4.55 3.80 - 5.10 Million/uL   Hemoglobin 14.2 11.7 - 15.5 g/dL   HCT 42.2 35 - 45 %   MCV 92.7 80.0 - 100.0 fL   MCH 31.2 27.0 - 33.0 pg   MCHC 33.6 32.0 - 36.0 g/dL   RDW 12.5 11.0 - 15.0 %   Platelets 324 140 - 400 Thousand/uL   MPV 10.6 7.5 - 12.5 fL   Neutro Abs 7,201 1,500 - 7,800 cells/uL   Lymphs Abs 2,825 850 - 3,900 cells/uL   Absolute Monocytes 514 200 - 950 cells/uL   Eosinophils Absolute 96 15 - 500 cells/uL   Basophils Absolute 64 0 - 200 cells/uL   Neutrophils Relative % 67.3 %   Total Lymphocyte 26.4 %   Monocytes Relative 4.8 %   Eosinophils Relative 0.9 %   Basophils Relative 0.6 %  COMPLETE METABOLIC PANEL WITH GFR  Result Value Ref Range   Glucose, Bld 85 65 - 99 mg/dL   BUN 11 7 - 25 mg/dL   Creat 0.78 0.50 - 1.05 mg/dL   GFR, Est Non African American 87 > OR = 60 mL/min/1.23m2   GFR, Est African American 101 > OR = 60 mL/min/1.1m2   BUN/Creatinine Ratio NOT APPLICABLE 6 - 22 (calc)   Sodium 141 135 - 146 mmol/L   Potassium 3.8 3.5 - 5.3 mmol/L   Chloride 105 98 - 110 mmol/L   CO2 25 20 - 32 mmol/L   Calcium 9.7 8.6 - 10.4 mg/dL   Total Protein 7.2 6.1 - 8.1 g/dL   Albumin 4.7 3.6 - 5.1 g/dL   Globulin 2.5 1.9 - 3.7 g/dL (calc)   AG Ratio 1.9 1.0 - 2.5 (calc)   Total Bilirubin 0.5 0.2 - 1.2 mg/dL   Alkaline phosphatase (APISO) 112 37 - 153 U/L   AST 15 10 - 35 U/L   ALT 13 6 - 29 U/L  TSH  Result Value Ref Range   TSH 2.14 mIU/L  H.  pylori breath test  Result Value Ref Range   H. pylori Breath Test NOT DETECTED NOT DETECT  Sedimentation rate  Result Value Ref Range   Sed Rate 14 0 - 30 mm/h  C-reactive protein  Result Value Ref Range   CRP 13.8 (H) <8.0 mg/L       Assessment & Plan:     ICD-10-CM   1. Allergic reaction to drug, initial encounter  T78.40XA Ambulatory referral to Allergy   some hives and multiple generalized reactions after initial covid vaccine  Suspect some mild allergy? With hives, resolved without meds or treatment - no concern for anaphylactic rxn to second covid shot, she has similar sx with annual flu but has gotten for years without incident She notes everything began after getting rabies shots? She has discussed with rheumatology, sought out ID referral and they deferred to allergist - referral placed today -    2. Arthralgia, unspecified joint  M25.50    per RA- f/up rheumatology regarding flares/med adjustements or inquire as to expectation of RA flare with other illness/vaccines/ etc  3. Hives  L50.9 Ambulatory referral to Allergy   intermittent and occurred after initial pfizer vaccine, resolved w/o meds, she did take benadryl later that day - 30 min obs with 2nd vax, may have worse rash/se - she did not have anaphylactic reaction - no airway involvement, currently no contraindication to 2nd COVID vaccine - I offered note to allow her to  DELAY 2nd shot to allow her to talk to allergist, but pt want to go ahead with her second COVID vaccine.      4. Vaccine counseling  Z71.89    counseling and education re covid vaccines contraindications and expected SE Handouts and vaccine info given to pt today from [facility] and from  regarding COVID vaccine medical contraindications and exemptions     5. Muscle spasm  M62.838    scattered intermittents spasms, cramps, fasciculations - she believes from vaccine SE, encouraged hydration, electrolyte supplements, trial mag supplement     Greater than 50% of this visit was spent in direct face-to-face counseling, obtaining history and physical, discussing and educating pt on treatment plan.  Total time of this visit was 45 min +.  Remainder of time involved but was not limited to reviewing chart (recent and pertinent OV notes and labs), documentation in EMR, and coordinating care and treatment plan. Most time was spent counseling pt regarding covid vaccine, educating pt on expected SE, and reviewing concerning sx, reviewing options today.     Delsa Grana, PA-C 10/31/19 11:03 AM

## 2019-10-31 ENCOUNTER — Encounter: Payer: Self-pay | Admitting: Family Medicine

## 2019-10-31 ENCOUNTER — Ambulatory Visit: Payer: BC Managed Care – PPO | Admitting: Family Medicine

## 2019-10-31 ENCOUNTER — Other Ambulatory Visit: Payer: Self-pay

## 2019-10-31 VITALS — BP 120/76 | HR 100 | Temp 97.6°F | Resp 16 | Ht 68.0 in | Wt 147.6 lb

## 2019-10-31 DIAGNOSIS — L509 Urticaria, unspecified: Secondary | ICD-10-CM | POA: Diagnosis not present

## 2019-10-31 DIAGNOSIS — Z7189 Other specified counseling: Secondary | ICD-10-CM

## 2019-10-31 DIAGNOSIS — M62838 Other muscle spasm: Secondary | ICD-10-CM

## 2019-10-31 DIAGNOSIS — T7840XA Allergy, unspecified, initial encounter: Secondary | ICD-10-CM

## 2019-10-31 DIAGNOSIS — M2550 Pain in unspecified joint: Secondary | ICD-10-CM

## 2019-10-31 DIAGNOSIS — Z7185 Encounter for immunization safety counseling: Secondary | ICD-10-CM

## 2019-10-31 NOTE — Patient Instructions (Addendum)
Per [facility] [employer]:  "No vaccine exemptions UNLESS patient has a documented SEVERE allergy (anaphylaxis or severe reaction within 4 hours of receiving the vaccine) to a first injection of COVID vaccine, or documented allergy to any of its components - this is the ONLY medical contraindication to the vaccine"  See handout from [facility] regarding expected vaccine side effects  You should stay for 30 minutes observation after next vaccine.  If you want a note to delay until consulting with allergy specialist I'm happy to write that just let me know

## 2019-11-02 ENCOUNTER — Telehealth: Payer: Self-pay | Admitting: Family Medicine

## 2019-11-02 ENCOUNTER — Telehealth: Payer: Self-pay

## 2019-11-02 NOTE — Telephone Encounter (Signed)
Patient is calling to request letter to postpone the 2nd dose of the vaccine until she sees the allegrist for her employer. Patient is requesting referral for the referral for allergist. Patient is requesting call back when letter is ready.Please advise CB- [phone]

## 2019-11-02 NOTE — Telephone Encounter (Signed)
Copied from [place] [id number]. Topic: Referral - Status >> Nov 01, 2019  2:11 PM Oneta Rack wrote: As per [place] [facility] patient should be referred to a "teaching hospital" such as [facility] or [facility] . Velora Heckler can not comply with referral placed on 9/15   Referral will be sent to [facility] instead.

## 2019-11-02 NOTE — Telephone Encounter (Signed)
Patient's MyChart was activated. And referral information was shared with the patient for the allergist.

## 2019-11-02 NOTE — Telephone Encounter (Signed)
Were you able to forward this same referral to [facility] or do I need to put it in again?

## 2019-11-02 NOTE — Telephone Encounter (Signed)
I was able to use the same referral

## 2019-11-05 ENCOUNTER — Telehealth: Payer: Self-pay | Admitting: Family Medicine

## 2019-11-05 NOTE — Telephone Encounter (Signed)
Pt came by the office and states she was told she could get a letter stating she can hold off on getting her second covid vaccine. Pt wanted to go ahead get this letter and its ok to drop it in her MyChart or if it gets done today she can come by the office to get it. She needs it by tomorrow so she can take it to [facility]. Please advise.

## 2019-11-06 NOTE — Telephone Encounter (Signed)
I offered this at the time of her appointment and she declined and said she was going to get her COVID shot.  Calls/msgs from patients are usually gotten to in a few days - and with providers out of the office yesterday and busy schedule I had not seen this until today (less than 24 hours after her msg)  When I get time to write a letter I will bring it up to you and then we can call and let her know.  Thanks  LT

## 2019-11-06 NOTE — Telephone Encounter (Signed)
Pt stopped by to check status on this letter. She said the letter does not have to be long. Stated that it has to be turned in today in order for it to be approved. She would like to call when its ready [phone]

## 2019-11-06 NOTE — Telephone Encounter (Signed)
Pt did receive her note via mychart and thanks you

## 2019-11-06 NOTE — Telephone Encounter (Signed)
Spoke with pt and she did receive her letter through [facility] and thanks you.

## 2019-12-10 ENCOUNTER — Other Ambulatory Visit: Payer: Self-pay

## 2019-12-10 ENCOUNTER — Ambulatory Visit: Payer: BC Managed Care – PPO | Admitting: Family Medicine

## 2019-12-10 ENCOUNTER — Encounter: Payer: Self-pay | Admitting: Family Medicine

## 2019-12-10 VITALS — BP 110/70 | HR 105 | Temp 98.2°F | Resp 16 | Ht 68.0 in | Wt 147.1 lb

## 2019-12-10 DIAGNOSIS — Z1159 Encounter for screening for other viral diseases: Secondary | ICD-10-CM | POA: Diagnosis not present

## 2019-12-10 DIAGNOSIS — M5137 Other intervertebral disc degeneration, lumbosacral region: Secondary | ICD-10-CM

## 2019-12-10 DIAGNOSIS — M069 Rheumatoid arthritis, unspecified: Secondary | ICD-10-CM | POA: Diagnosis not present

## 2019-12-10 DIAGNOSIS — E559 Vitamin D deficiency, unspecified: Secondary | ICD-10-CM

## 2019-12-10 DIAGNOSIS — D1809 Hemangioma of other sites: Secondary | ICD-10-CM | POA: Diagnosis not present

## 2019-12-10 DIAGNOSIS — M62838 Other muscle spasm: Secondary | ICD-10-CM

## 2019-12-10 DIAGNOSIS — R1319 Other dysphagia: Secondary | ICD-10-CM

## 2019-12-10 DIAGNOSIS — R1013 Epigastric pain: Secondary | ICD-10-CM

## 2019-12-10 NOTE — Progress Notes (Signed)
Name: Kimberly Cantrell   MRN: [id number]    DOB: May 21, 1965   Date:12/10/2019       Progress Note  Subjective  Chief Complaint  Chief Complaint  Patient presents with  . Follow-up  . Spasms    HPI  Palpitation: she states palpitation that started suddenly on Good Friday resolved when she went on vacation and spent 3 hours at a church in [place] and symptoms resolved. She has a cardiologist - Saw PA from Dr. Etta Quill office she was advised to have holter monitor and stress myoview - but she states because symptoms had resolved they decided to hold off, she states not having problems since.   Dysphagia: she was seen by Dr. Durwin Reges, was supposed to have EGD and colonoscopy done by Dr. Durwin Reges , she states no longer having symptoms so it was placed on hold also   Hemangioma spine: incidental finding, she still has mild back pain but no longer taking nsaid;s because it caused some many issues.   RA: she states last flare was this past Spring, when she had palpitation. She states she had bilateral hand soreness, erythema and stiffness. She states pain is not as intense but still has stiffness at night. She is under the care of Dr. Jefm Bryant and taking plaquenil prn   Insomnia: takes Ambien to stay asleep since her back always wakes up up all night. Getting it from [facility] and unchanged   Reaction to COVID-19: she developed hives on hands and feet right after vaccine and for a few days had some palpitation  Muscle spasms: myoclonus on hands and feet and sometimes has calf pain/sharp. She states worse at night, and after she has been sitting for a while.  She states it started after COVID-19 vaccine, she is worried that is related to the vaccine, she is waiting to see a allergist   Chronic lower back pain: she was seen by PA Eliberto Ivory, she will start PT, and if she is considering having surgery if needed, has radiation on both buttocks. She was seen by Dr. Phyllis Ginger in the past for her neck, she asked  about Dr. Holley Raring for a nerve block but she states pain is tolerable now and prefers holding off for now     Patient Active Problem List   Diagnosis Date Noted  . RA (rheumatoid arthritis) ([place]) 07/19/2018  . Mild intermittent asthma 05/10/2018  . Wheezing 05/10/2018  . CRP elevated 04/18/2018  . 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 Other insomnia 03/06/2018  . Vitamin D deficiency 03/03/2018  . Mixed hyperlipidemia 03/03/2018  . Joint stiffness 03/03/2018  . Arthralgia 03/03/2018  . Spinal hemangioma 03/03/2018  . Blurred vision 03/03/2018  . Vaginal atrophy 10/27/2016  . Perimenopausal vasomotor symptoms 10/27/2016  . Well woman exam with routine gynecological exam 10/07/2015  . Endometriosis 10/07/2015  . Bone lesion 02/07/2015    Past Surgical History:  Procedure Laterality Date  . ABDOMINAL HYSTERECTOMY    . CHOLECYSTECTOMY  2007  . HERNIA REPAIR     age 54  . NASAL SINUS SURGERY     age 54  . OOPHORECTOMY    . PARTIAL HYSTERECTOMY     1 ovary remaining    Family History  Problem Relation Age of Onset  . Heart attack Father   . Diabetes Father   . Diabetes Maternal Aunt   . Diabetes Maternal Uncle   . Diabetes Paternal Uncle   . Diabetes Maternal Grandmother   . Diabetes  Paternal Grandmother   . Cancer Neg Hx   . Ovarian cancer Neg Hx     Social History   Tobacco Use  . Smoking status: Never Smoker  . Smokeless tobacco: Never Used  Substance Use Topics  . Alcohol use: No    Alcohol/week: 0.0 standard drinks     Current Outpatient Medications:  .  albuterol (PROVENTIL HFA;VENTOLIN HFA) 108 (90 Base) MCG/ACT inhaler, Inhale 2 puffs into the lungs every 6 (six) hours as needed for wheezing or shortness of breath., Disp: 1 Inhaler, Rfl: 1 .  hydroxychloroquine (PLAQUENIL) 200 MG tablet, Take 200 mg by mouth as needed. , Disp: , Rfl:  .  omeprazole (PRILOSEC) 40 MG capsule, TAKE 1 CAPSULE BY MOUTH EVERY DAY, Disp: 90  capsule, Rfl: 0 .  zolpidem (AMBIEN) 10 MG tablet, Take 1 tablet (10 mg total) by mouth at bedtime as needed. TAKE 1 TABLET BY MOUTH EVERY DAY, Disp: 30 tablet, Rfl: 3  Allergies  Allergen Reactions  . Metronidazole Other (See Comments)  . Clindamycin Hcl Rash  . Ketoconazole Rash    I personally reviewed active problem list, medication list, allergies, family history, social history, health maintenance with the patient/caregiver today.   ROS  Constitutional: Negative for fever or weight change.  Respiratory: Negative for cough and shortness of breath.   Cardiovascular: Negative for chest pain or palpitations.  Gastrointestinal: Negative for abdominal pain, no bowel changes.  Musculoskeletal: Negative for gait problem or joint swelling.  Skin: Negative for rash.  Neurological: Negative for dizziness or headache.  No other specific complaints in a complete review of systems (except as listed in HPI above).  Objective   Vitals:   12/10/19 1327  BP: 110/70  Pulse: (!) 105  Resp: 16  Temp: 98.2 F (36.8 C)  TempSrc: Oral  SpO2: 99%  Weight: 147 lb 1.6 oz (66.7 kg)  Height: 5\' 8"  (1.727 m)    Body mass index is 22.37 kg/m.  Physical Exam  Constitutional: Patient appears well-developed and well-nourished.  No distress.  HEENT: head atraumatic, normocephalic, pupils equal and reactive to light,  neck supple Cardiovascular: Normal rate, regular rhythm and normal heart sounds.  No murmur heard. No BLE edema. Pulmonary/Chest: Effort normal and breath sounds normal. No respiratory distress. Abdominal: Soft.  There is no tenderness. Psychiatric: Patient has a normal mood and affect. behavior is normal. Judgment and thought content normal.  PHQ2/9: Depression screen [facility] 2/9 10/31/2019 08/10/2019 05/09/2019 07/19/2018 05/10/2018  Decreased Interest 0 0 0 0 0  Down, Depressed, Hopeless 0 0 0 0 0  PHQ - 2 Score 0 0 0 0 0  Altered sleeping - 0 0 0 0  Tired, decreased energy - 0 0 0  0  Change in appetite - 0 0 0 0  Feeling bad or failure about yourself  - 0 0 0 0  Trouble concentrating - 0 0 0 0  Moving slowly or fidgety/restless - 0 0 0 0  Suicidal thoughts - 0 0 0 0  PHQ-9 Score - 0 0 0 0  Difficult doing work/chores - - Not difficult at all Not difficult at all Not difficult at all    phq 9 is negative   Fall Risk: Fall Risk  12/10/2019 10/31/2019 08/10/2019 05/09/2019 07/19/2018  Falls in the past year? 0 0 0 0 0  Number falls in past yr: 0 0 0 0 -  Injury with Fall? 0 0 0 0 -  Follow up - Falls evaluation completed - - -  Functional Status Survey: Is the patient deaf or have difficulty hearing?: No Does the patient have difficulty seeing, even when wearing glasses/contacts?: No Does the patient have difficulty concentrating, remembering, or making decisions?: No Does the patient have difficulty walking or climbing stairs?: No Does the patient have difficulty dressing or bathing?: No Does the patient have difficulty doing errands alone such as visiting a doctor's office or shopping?: No    Assessment & Plan  1. Rheumatoid arthritis involving multiple sites, unspecified whether rheumatoid factor present ([place])  - CBC with Differential/Platelet - COMPLETE METABOLIC PANEL WITH GFR  2. Hemangioma of spine   3. Need for hepatitis C screening test  - Hepatitis C antibody  4. DDD (degenerative disc disease), lumbosacral   5. Dyspepsia  Improved  6. Vitamin D deficiency  - VITAMIN D 25 Hydroxy (Vit-D Deficiency, Fractures)  7. Esophageal dysphagia  Resolved, so decided not to have the procedure done   8. Muscle spasm  - B12 and Folate Panel - Magnesium - Iron, TIBC and Ferritin Panel   She asked me for a letter exempting her from getting the flu shot, but explained I cannot do that since she is not allergic to the flu vaccine

## 2019-12-25 ENCOUNTER — Encounter: Payer: BC Managed Care – PPO | Admitting: Certified Nurse Midwife

## 2019-12-26 ENCOUNTER — Ambulatory Visit: Payer: BC Managed Care – PPO | Admitting: Certified Nurse Midwife

## 2019-12-26 ENCOUNTER — Encounter: Payer: Self-pay | Admitting: Certified Nurse Midwife

## 2019-12-26 ENCOUNTER — Other Ambulatory Visit: Payer: Self-pay

## 2019-12-26 VITALS — BP 112/79 | HR 91 | Ht 68.0 in | Wt 147.1 lb

## 2019-12-26 DIAGNOSIS — R102 Pelvic and perineal pain: Secondary | ICD-10-CM | POA: Diagnosis not present

## 2019-12-26 MED ORDER — ORILISSA 150 MG PO TABS: 150.0000 mg | ORAL_TABLET | Freq: Every day | ORAL | 6 refills | Status: DC

## 2019-12-26 NOTE — Patient Instructions (Signed)
Endometriosis  Endometriosis is a condition in which the tissue that lines the uterus (endometrium) grows outside of its normal location. The tissue may grow in many locations close to the uterus, but it commonly grows on the ovaries, fallopian tubes, vagina, or bowel. When the uterus sheds the endometrium every menstrual cycle, there is bleeding wherever the endometrial tissue is located. This can cause pain because blood is irritating to tissues that are not normally exposed to it. What are the causes? The cause of endometriosis is not known. What increases the risk? You may be more likely to develop endometriosis if you:  Have a family history of endometriosis.  Have never given birth.  Started your period at age 10 or younger.  Have high levels of estrogen in your body.  Were exposed to a certain medicine (diethylstilbestrol) before you were born (in utero).  Had low birth weight.  Were born as a twin, triplet, or other multiple.  Have a BMI of less than 25. BMI is an estimate of body fat and is calculated from height and weight. What are the signs or symptoms? Often, there are no symptoms of this condition. If you do have symptoms, they may:  Vary depending on where your endometrial tissue is growing.  Occur during your menstrual period (most common) or midcycle.  Come and go, or you may go months with no symptoms at all.  Stop with menopause. Symptoms may include:  Pain in the back or abdomen.  Heavier bleeding during periods.  Pain during sex.  Painful bowel movements.  Infertility.  Pelvic pain.  Bleeding more than once a month. How is this diagnosed? This condition is diagnosed based on your symptoms and a physical exam. You may have tests, such as:  Blood tests and urine tests. These may be done to help rule out other possible causes of your symptoms.  Ultrasound, to look for abnormal tissues.  An X-ray of the lower bowel (barium enema).  An  ultrasound that is done through the vagina (transvaginally).  CT scan.  MRI.  Laparoscopy. In this procedure, a lighted, pencil-sized instrument called a laparoscope is inserted into your abdomen through an incision. The laparoscope allows your health care provider to look at the organs inside your body and check for abnormal tissue to confirm the diagnosis. If abnormal tissue is found, your health care provider may remove a small piece of tissue (biopsy) to be examined under a microscope. How is this treated? Treatment for this condition may include:  Medicines to relieve pain, such as NSAIDs.  Hormone therapy. This involves using artificial (synthetic) hormones to reduce endometrial tissue growth. Your health care provider may recommend using a hormonal form of birth control, or other medicines.  Surgery. This may be done to remove abnormal endometrial tissue. ? In some cases, tissue may be removed using a laparoscope and a laser (laparoscopic laser treatment). ? In severe cases, surgery may be done to remove the fallopian tubes, uterus, and ovaries (hysterectomy). Follow these instructions at home:  Take over-the-counter and prescription medicines only as told by your health care provider.  Do not drive or use heavy machinery while taking prescription pain medicine.  Try to avoid activities that cause pain, including sexual activity.  Keep all follow-up visits as told by your health care provider. This is important. Contact a health care provider if:  You have pain in the area between your hip bones (pelvic area) that occurs: ? Before, during, or after your period. ?   In between your period and gets worse during your period. ? During or after sex. ? With bowel movements or urination, especially during your period.  You have problems getting pregnant.  You have a fever. Get help right away if:  You have severe pain that does not get better with medicine.  You have severe  nausea and vomiting, or you cannot eat without vomiting.  You have pain that affects only the lower, right side of your abdomen.  You have abdominal pain that gets worse.  You have abdominal swelling.  You have blood in your stool. This information is not intended to replace advice given to you by your health care provider. Make sure you discuss any questions you have with your health care provider. Document Revised: 01/14/2017 Document Reviewed: 07/05/2015 Elsevier Patient Education  2020 [employer].

## 2019-12-26 NOTE — Progress Notes (Signed)
GYN ENCOUNTER NOTE  Subjective:       Kimberly Cantrell is a 54 y.o. G38P3003 female is here for gynecologic evaluation of the following issues:  1. menstrual like cramping for the past 6-8 wks with a constant pelvic pressure.  UTI like symptoms , she has had UTI ruled out by her PCP. Pt state she has history of ovarian cysts as well as fibroids and endometriosis She is  status post LAVH LSO and right salpingectomy, along with excisional biopsies of endometriosis. She has had a oophorectomy of one ovary because it was the one with recurrent cysts. She believes that she still has right ovary.   Gynecologic History No LMP recorded. Patient has had a hysterectomy. Contraception: status post hysterectomy Last Pap: 2012. Results were: normal Last mammogram: 2016. Results were: normal  Obstetric History OB History  Gravida Para Term Preterm AB Living  3 3 3     3   SAB TAB Ectopic Multiple Live Births          3    # Outcome Date GA Lbr Len/2nd Weight Sex Delivery Anes PTL Lv  3 Term 2003    F Vag-Spont   LIV  2 Term 2000    M Vag-Spont   LIV  1 Term 1998    F Vag-Spont   LIV    Past Medical History:  Diagnosis Date  . Bone lesion 02/07/2015  . 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 DDD (degenerative disc disease), cervical   . DJD (degenerative joint disease) of cervical spine   . Endometriosis   . Vitamin D deficiency     Past Surgical History:  Procedure Laterality Date  . ABDOMINAL HYSTERECTOMY    . CHOLECYSTECTOMY  2007  . HERNIA REPAIR     age 79  . NASAL SINUS SURGERY     age 75  . OOPHORECTOMY    . PARTIAL HYSTERECTOMY     1 ovary remaining    Current Outpatient Medications on File Prior to Visit  Medication Sig Dispense Refill  . albuterol (PROVENTIL HFA;VENTOLIN HFA) 108 (90 Base) MCG/ACT inhaler Inhale 2 puffs into the lungs every 6 (six) hours as needed for wheezing or shortness of breath. 1 Inhaler 1  . hydroxychloroquine  (PLAQUENIL) 200 MG tablet Take 200 mg by mouth as needed.     Marland Kitchen omeprazole (PRILOSEC) 40 MG capsule TAKE 1 CAPSULE BY MOUTH EVERY DAY 90 capsule 0  . zolpidem (AMBIEN) 10 MG tablet Take 1 tablet (10 mg total) by mouth at bedtime as needed. TAKE 1 TABLET BY MOUTH EVERY DAY 30 tablet 3   No current facility-administered medications on file prior to visit.    Allergies  Allergen Reactions  . Metronidazole Other (See Comments)  . Clindamycin Hcl Rash  . Ketoconazole Rash    Social History   Socioeconomic History  . Marital status: Married    Spouse name: Not on file  . Number of children: Not on file  . Years of education: Not on file  . Highest education level: Not on file  Occupational History  . Not on file  Tobacco Use  . Smoking status: Never Smoker  . Smokeless tobacco: Never Used  Vaping Use  . Vaping Use: Never used  Substance and Sexual Activity  . Alcohol use: No    Alcohol/week: 0.0 standard drinks  . Drug use: No  . Sexual activity: Yes    Partners: Male  Birth control/protection: Surgical  Other Topics Concern  . Not on file  Social History Narrative  . Not on file   Social Determinants of Health   Financial Resource Strain:   . Difficulty of Paying Living Expenses: Not on file  Food Insecurity:   . Worried About Charity fundraiser in the Last Year: Not on file  . Ran Out of Food in the Last Year: Not on file  Transportation Needs:   . Lack of Transportation (Medical): Not on file  . Lack of Transportation (Non-Medical): Not on file  Physical Activity:   . Days of Exercise per Week: Not on file  . Minutes of Exercise per Session: Not on file  Stress:   . Feeling of Stress : Not on file  Social Connections:   . Frequency of Communication with Friends and Family: Not on file  . Frequency of Social Gatherings with Friends and Family: Not on file  . Attends Religious Services: Not on file  . Active Member of Clubs or Organizations: Not on file  .  Attends Archivist Meetings: Not on file  . Marital Status: Not on file  Intimate Partner Violence:   . Fear of Current or Ex-Partner: Not on file  . Emotionally Abused: Not on file  . Physically Abused: Not on file  . Sexually Abused: Not on file    Family History  Problem Relation Age of Onset  . Heart attack Father   . Diabetes Father   . Diabetes Maternal Aunt   . Diabetes Maternal Uncle   . Diabetes Paternal Uncle   . Diabetes Maternal Grandmother   . Diabetes Paternal Grandmother   . Cancer Neg Hx   . Ovarian cancer Neg Hx     The following portions of the patient's history were reviewed and updated as appropriate: allergies, current medications, past family history, past medical history, past social history, past surgical history and problem list.  Review of Systems Review of Systems - Negative except as mentioned in HPI Review of Systems - General ROS: negative for - chills, fatigue, fever, hot flashes, malaise or night sweats Hematological and Lymphatic ROS: negative for - bleeding problems or swollen lymph nodes Gastrointestinal ROS: negative for - abdominal pain, blood in stools, change in bowel habits and nausea/vomiting Musculoskeletal ROS: negative for - joint pain, muscle pain or muscular weakness Genito-Urinary ROS: negative for - change in menstrual cycle, dysmenorrhea, dyspareunia, dysuria, genital discharge, genital ulcers, hematuria, incontinence, irregular/heavy menses, nocturia or pelvic painjj  Objective:   BP 112/79   Pulse 91   Ht 5\' 8"  (1.727 m)   Wt 147 lb 1 oz (66.7 kg)   BMI 22.36 kg/m  CONSTITUTIONAL: Well-developed, well-nourished female in no acute distress.  HENT:  Normocephalic, atraumatic.  NECK: Normal range of motion, supple, no masses.  Normal thyroid.  SKIN: Skin is warm and dry. No rash noted. Not diaphoretic. No erythema. No pallor. [place]: Alert and oriented to person, place, and time. PSYCHIATRIC: Normal mood and  affect. Normal behavior. Normal judgment and thought content. CARDIOVASCULAR:Not Examined RESPIRATORY: Not Examined BREASTS: Not Examined ABDOMEN: Soft, non distended; Non tender.  No Organomegaly. PELVIC:not indicated MUSCULOSKELETAL: Normal range of motion. No tenderness.  No cyanosis, clubbing, or edema.     Assessment:   Endometriosis Pelvic pressure  Plan:  U/s ordered to r/o cyst on remaining ovary. Discussed treatment options for endometrial pain. Including medication management vs surgical management. She would like to try medications first. Freida Busman 150  mg PO q day ordered. Discussed risks and benefits , possible side effects of medication and duration of use. She verbalizes and agrees. Follow up for u/s .   Philip Aspen, CNM

## 2020-01-01 ENCOUNTER — Other Ambulatory Visit: Payer: BC Managed Care – PPO

## 2020-01-02 ENCOUNTER — Ambulatory Visit (INDEPENDENT_AMBULATORY_CARE_PROVIDER_SITE_OTHER): Payer: BC Managed Care – PPO

## 2020-01-02 ENCOUNTER — Other Ambulatory Visit: Payer: Self-pay

## 2020-01-02 DIAGNOSIS — R102 Pelvic and perineal pain: Secondary | ICD-10-CM

## 2020-01-16 ENCOUNTER — Other Ambulatory Visit: Payer: Self-pay | Admitting: Certified Nurse Midwife

## 2020-01-17 ENCOUNTER — Telehealth: Payer: Self-pay

## 2020-01-17 NOTE — Telephone Encounter (Signed)
mychart message sent to patient re: refill Ambien

## 2020-01-18 ENCOUNTER — Telehealth: Payer: Self-pay

## 2020-01-18 NOTE — Telephone Encounter (Signed)
mychart message sent to patient

## 2020-01-21 ENCOUNTER — Other Ambulatory Visit: Payer: Self-pay | Admitting: Certified Nurse Midwife

## 2020-01-21 NOTE — Progress Notes (Unsigned)
a 

## 2020-06-12 ENCOUNTER — Other Ambulatory Visit: Payer: Self-pay | Admitting: Certified Nurse Midwife

## 2020-06-18 ENCOUNTER — Other Ambulatory Visit: Payer: Self-pay | Admitting: Certified Nurse Midwife

## 2020-11-11 ENCOUNTER — Other Ambulatory Visit: Payer: Self-pay | Admitting: Certified Nurse Midwife

## 2020-11-11 ENCOUNTER — Telehealth: Payer: BC Managed Care – PPO | Admitting: Certified Nurse Midwife

## 2020-11-11 MED ORDER — ZOLPIDEM TARTRATE 10 MG PO TABS: 10.0000 mg | ORAL_TABLET | Freq: Every evening | ORAL | 3 refills | Status: DC | PRN

## 2020-11-11 NOTE — Telephone Encounter (Signed)
Patient called asking for a refill on Ambien, confirmed pharmacy as [facility] on [address]. Please Advise.

## 2020-11-11 NOTE — Telephone Encounter (Signed)
LVM with patient informing refill has been sent.

## 2020-12-10 ENCOUNTER — Encounter: Payer: Self-pay | Admitting: Family Medicine

## 2020-12-10 ENCOUNTER — Other Ambulatory Visit: Payer: Self-pay

## 2020-12-10 ENCOUNTER — Ambulatory Visit: Payer: BC Managed Care – PPO | Admitting: Family Medicine

## 2020-12-10 VITALS — BP 110/72 | HR 94 | Temp 98.0°F | Resp 16 | Ht 68.0 in | Wt 151.0 lb

## 2020-12-10 DIAGNOSIS — E785 Hyperlipidemia, unspecified: Secondary | ICD-10-CM

## 2020-12-10 DIAGNOSIS — Z131 Encounter for screening for diabetes mellitus: Secondary | ICD-10-CM

## 2020-12-10 DIAGNOSIS — M62838 Other muscle spasm: Secondary | ICD-10-CM

## 2020-12-10 DIAGNOSIS — M5030 Other cervical disc degeneration, unspecified cervical region: Secondary | ICD-10-CM

## 2020-12-10 DIAGNOSIS — M51379 Other intervertebral disc degeneration, lumbosacral region without mention of lumbar back pain or lower extremity pain: Secondary | ICD-10-CM

## 2020-12-10 DIAGNOSIS — Z1231 Encounter for screening mammogram for malignant neoplasm of breast: Secondary | ICD-10-CM

## 2020-12-10 DIAGNOSIS — G4709 Other insomnia: Secondary | ICD-10-CM

## 2020-12-10 DIAGNOSIS — Z1159 Encounter for screening for other viral diseases: Secondary | ICD-10-CM

## 2020-12-10 DIAGNOSIS — E559 Vitamin D deficiency, unspecified: Secondary | ICD-10-CM | POA: Diagnosis not present

## 2020-12-10 DIAGNOSIS — M5137 Other intervertebral disc degeneration, lumbosacral region: Secondary | ICD-10-CM | POA: Diagnosis not present

## 2020-12-10 DIAGNOSIS — Z23 Encounter for immunization: Secondary | ICD-10-CM

## 2020-12-10 DIAGNOSIS — Z79899 Other long term (current) drug therapy: Secondary | ICD-10-CM

## 2020-12-10 DIAGNOSIS — M1380 Other specified arthritis, unspecified site: Secondary | ICD-10-CM

## 2020-12-10 DIAGNOSIS — M899 Disorder of bone, unspecified: Secondary | ICD-10-CM

## 2020-12-10 DIAGNOSIS — Z114 Encounter for screening for human immunodeficiency virus [HIV]: Secondary | ICD-10-CM

## 2020-12-10 DIAGNOSIS — K219 Gastro-esophageal reflux disease without esophagitis: Secondary | ICD-10-CM

## 2020-12-10 DIAGNOSIS — M064 Inflammatory polyarthropathy: Secondary | ICD-10-CM

## 2020-12-10 MED ORDER — OMEPRAZOLE 40 MG PO CPDR: 40.0000 mg | DELAYED_RELEASE_CAPSULE | Freq: Every day | ORAL | 0 refills | Status: DC

## 2020-12-10 NOTE — Progress Notes (Signed)
Name: Kimberly Cantrell   MRN: [id number]    DOB: 06-08-1965   Date:12/10/2020       Progress Note  Subjective  Chief Complaint  Follow Up  HPI   Hemangioma spine: incidental finding, she still has mild back pain but no longer taking nsaid;s because it caused increase of GERD and dyspepsia   Inflammatory Arthritis, negative RF: seen by Dr. Jefm Bryant, she has not been taking Plaquenil on a regular basis, she states feels stiff usually in am's for about  30 minutes, no redness or increase in warmth. She takes a warm shower and stretches for about about 10-15 minutes and is able to move well. Worse on her hips also has on wrists and hands, she is able to function. She is not taking plaquenil at this time.   Insomnia: takes Ambien to stay asleep since her neck and  back always wakes up up all night. Getting it from [facility] , she has an active prescription  Reaction to COVID-19: she developed hives on hands and feet right after vaccine and for a few days had some palpitation, afraid of getting the booster   Chronic lower back pain/neck pain : she was seen by PA Eliberto Ivory, she will start PT, and if she is considering having surgery if needed, has radiation on both buttocks. She was seen by Dr. Phyllis Ginger in the past for her neck,she is having worsening of symptoms on arms and hands, she denies weakness , she is willing to see Dr. 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   Other insomnia 03/06/2018   Vitamin D deficiency 03/03/2018   Mixed hyperlipidemia 03/03/2018   Joint stiffness 03/03/2018   Arthralgia 03/03/2018   Spinal hemangioma 03/03/2018   Blurred vision 03/03/2018   Vaginal atrophy 10/27/2016   Perimenopausal vasomotor symptoms 10/27/2016   Well woman exam with  routine gynecological exam 10/07/2015   Endometriosis 10/07/2015   Bone lesion 02/07/2015    Past Surgical History:  Procedure Laterality Date   ABDOMINAL HYSTERECTOMY     CHOLECYSTECTOMY  2007   HERNIA REPAIR     age 45   NASAL SINUS SURGERY     age 46   OOPHORECTOMY     PARTIAL HYSTERECTOMY     1 ovary remaining    Family History  Problem Relation Age of Onset   Heart attack Father    Diabetes Father    Diabetes Maternal Aunt    Diabetes Maternal Uncle    Diabetes Paternal Uncle    Diabetes Maternal Grandmother    Diabetes Paternal Grandmother    Cancer Neg Hx    Ovarian cancer Neg Hx     Social History   Tobacco Use   Smoking status: Never   Smokeless tobacco: Never  Substance Use Topics   Alcohol use: No    Alcohol/week: 0.0 standard drinks     Current Outpatient Medications:    albuterol (PROVENTIL HFA;VENTOLIN HFA) 108 (90 Base) MCG/ACT inhaler, Inhale 2 puffs into the lungs every 6 (six) hours as needed for wheezing or shortness of breath., Disp: 1 Inhaler, Rfl: 1   omeprazole (PRILOSEC) 40 MG capsule, TAKE 1 CAPSULE BY MOUTH EVERY DAY, Disp: 90 capsule, Rfl: 0   zolpidem (AMBIEN) 10 MG tablet, Take 1 tablet (10 mg total)  by mouth at bedtime as needed. TAKE 1 TABLET BY MOUTH EVERY DAY, Disp: 30 tablet, Rfl: 3   Elagolix Sodium (ORILISSA) 150 MG TABS, Take 150 mg by mouth daily. (Patient not taking: Reported on 12/10/2020), Disp: 30 tablet, Rfl: 6   hydroxychloroquine (PLAQUENIL) 200 MG tablet, Take 200 mg by mouth as needed.  (Patient not taking: Reported on 12/10/2020), Disp: , Rfl:   Allergies  Allergen Reactions   Metronidazole Other (See Comments)   Clindamycin Hcl Rash   Ketoconazole Rash    I personally reviewed active problem list, medication list, allergies, family history, social history, health maintenance with the patient/caregiver today.   ROS  Constitutional: Negative for fever or weight change.  Respiratory: Negative for cough and  shortness of breath.   Cardiovascular: Negative for chest pain or palpitations.  Gastrointestinal: Negative for abdominal pain, no bowel changes.  Musculoskeletal: Negative for gait problem or joint swelling.  Skin: Negative for rash.  Neurological: Negative for dizziness or headache.  No other specific complaints in a complete review of systems (except as listed in HPI above).   Objective  Vitals:   12/10/20 1341  BP: 110/72  Pulse: 94  Resp: 16  Temp: 98 F (36.7 C)  SpO2: 97%  Weight: 151 lb (68.5 kg)  Height: 5\' 8"  (1.727 m)    Body mass index is 22.96 kg/m.  Physical Exam  Constitutional: Patient appears well-developed and well-nourished.  No distress.  HEENT: head atraumatic, normocephalic, pupils equal and reactive to light, neck supple Cardiovascular: Normal rate, regular rhythm and normal heart sounds.  No murmur heard. No BLE edema. Pulmonary/Chest: Effort normal and breath sounds normal. No respiratory distress. Abdominal: Soft.  There is no tenderness. Psychiatric: Patient has a normal mood and affect. behavior is normal. Judgment and thought content normal.    PHQ2/9: Depression screen [facility] 2/9 12/10/2020 10/31/2019 08/10/2019 05/09/2019 07/19/2018  Decreased Interest 0 0 0 0 0  Down, Depressed, Hopeless 0 0 0 0 0  PHQ - 2 Score 0 0 0 0 0  Altered sleeping 0 - 0 0 0  Tired, decreased energy 0 - 0 0 0  Change in appetite 0 - 0 0 0  Feeling bad or failure about yourself  0 - 0 0 0  Trouble concentrating 0 - 0 0 0  Moving slowly or fidgety/restless 0 - 0 0 0  Suicidal thoughts 0 - 0 0 0  PHQ-9 Score 0 - 0 0 0  Difficult doing work/chores - - - Not difficult at all Not difficult at all    phq 9 is negative   Fall Risk: Fall Risk  12/10/2020 12/10/2019 10/31/2019 08/10/2019 05/09/2019  Falls in the past year? 0 0 0 0 0  Number falls in past yr: 0 0 0 0 0  Injury with Fall? 0 0 0 0 0  Risk for fall due to : No Fall Risks - - - -  Follow up Falls prevention  discussed - Falls evaluation completed - -      Functional Status Survey: Is the patient deaf or have difficulty hearing?: No Does the patient have difficulty seeing, even when wearing glasses/contacts?: No Does the patient have difficulty concentrating, remembering, or making decisions?: No Does the patient have difficulty walking or climbing stairs?: No Does the patient have difficulty dressing or bathing?: No Does the patient have difficulty doing errands alone such as visiting a doctor's office or shopping?: No    Assessment & Plan  1. Undifferentiated inflammatory  arthritis (HCC)  - C-reactive protein - Sedimentation rate  2. Vitamin D deficiency  - VITAMIN D 25 Hydroxy (Vit-D Deficiency, Fractures)  3. DDD (degenerative disc disease), lumbosacral  - Ambulatory referral to Pain Clinic - Ambulatory referral to Physical Therapy  4. DDD (degenerative disc disease), cervical  - Ambulatory referral to Pain Clinic - Ambulatory referral to Physical Therapy  5. Other insomnia  Continue Ambien   6. Muscle spasm   7. GERD without esophagitis  - omeprazole (PRILOSEC) 40 MG capsule; Take 1 capsule (40 mg total) by mouth daily.  Dispense: 90 capsule; Refill: 0  8. Need for shingles vaccine  Refused   9. Need for pneumococcal vaccine  - Hemoglobin A1c  10. Breast cancer screening by mammogram  - MM 3D SCREEN BREAST BILATERAL; Future  11. Need for hepatitis C screening test  - Hepatitis C antibody  12. Encounter for screening for HIV  - HIV Antibody (routine testing w rflx)  13. Needs flu shot  She will get it at work   14. Dyslipidemia  - Lipid panel  15. Diabetes mellitus screening  - Hemoglobin A1c  16. Long-term use of high-risk medication  - CBC with Differential/Platelet - COMPLETE METABOLIC PANEL WITH GFR  17. Lesion of lumbar spine  - MR Lumbar Spine W Wo Contrast; Future

## 2020-12-11 LAB — HEPATITIS C ANTIBODY
Hepatitis C Ab: NONREACTIVE
SIGNAL TO CUT-OFF: <0.02 (ref <1.00)

## 2020-12-11 LAB — LIPID PANEL
Cholesterol: 228 mg/dL — ABNORMAL HIGH (ref <200)
HDL: 46 mg/dL — ABNORMAL LOW (ref >=50)
LDL Cholesterol (Calc): 129 mg/dL (calc) — ABNORMAL HIGH
Non-HDL Cholesterol (Calc): 182 mg/dL (calc) — ABNORMAL HIGH (ref <130)
Total CHOL/HDL Ratio: 5 (calc) — ABNORMAL HIGH (ref <5.0)
Triglycerides: 369 mg/dL — ABNORMAL HIGH (ref <150)

## 2020-12-11 LAB — CBC WITH DIFFERENTIAL/PLATELET
Absolute Monocytes: 500 cells/uL (ref 200–950)
Basophils Absolute: 49 cells/uL (ref 0–200)
Basophils Relative: 0.5 %
Eosinophils Absolute: 118 cells/uL (ref 15–500)
Eosinophils Relative: 1.2 %
HCT: 40.4 % (ref 35.0–45.0)
Hemoglobin: 13.9 g/dL (ref 11.7–15.5)
Lymphs Abs: 2685 cells/uL (ref 850–3900)
MCH: 31.7 pg (ref 27.0–33.0)
MCHC: 34.4 g/dL (ref 32.0–36.0)
MCV: 92 fL (ref 80.0–100.0)
MPV: 10.2 fL (ref 7.5–12.5)
Monocytes Relative: 5.1 %
Neutro Abs: 6448 cells/uL (ref 1500–7800)
Neutrophils Relative %: 65.8 %
Platelets: 310 10*3/uL (ref 140–400)
RBC: 4.39 10*6/uL (ref 3.80–5.10)
RDW: 12.1 % (ref 11.0–15.0)
Total Lymphocyte: 27.4 %
WBC: 9.8 10*3/uL (ref 3.8–10.8)

## 2020-12-11 LAB — HEMOGLOBIN A1C
Hgb A1c MFr Bld: 4.9 % of total Hgb (ref <5.7)
Mean Plasma Glucose: 94 mg/dL
eAG (mmol/L): 5.2 mmol/L

## 2020-12-11 LAB — COMPLETE METABOLIC PANEL WITH GFR
AG Ratio: 1.8 (calc) (ref 1.0–2.5)
ALT: 12 U/L (ref 6–29)
AST: 12 U/L (ref 10–35)
Albumin: 4.3 g/dL (ref 3.6–5.1)
Alkaline phosphatase (APISO): 98 U/L (ref 37–153)
BUN: 10 mg/dL (ref 7–25)
CO2: 28 mmol/L (ref 20–32)
Calcium: 9.9 mg/dL (ref 8.6–10.4)
Chloride: 102 mmol/L (ref 98–110)
Creat: 0.85 mg/dL (ref 0.50–1.03)
Globulin: 2.4 g/dL (calc) (ref 1.9–3.7)
Glucose, Bld: 86 mg/dL (ref 65–99)
Potassium: 4.2 mmol/L (ref 3.5–5.3)
Sodium: 142 mmol/L (ref 135–146)
Total Bilirubin: 0.4 mg/dL (ref 0.2–1.2)
Total Protein: 6.7 g/dL (ref 6.1–8.1)
eGFR: 81 mL/min/{1.73_m2} (ref >=60)

## 2020-12-11 LAB — SEDIMENTATION RATE: Sed Rate: 14 mm/h (ref 0–30)

## 2020-12-11 LAB — VITAMIN D 25 HYDROXY (VIT D DEFICIENCY, FRACTURES): Vit D, 25-Hydroxy: 22 ng/mL — ABNORMAL LOW (ref 30–100)

## 2020-12-11 LAB — C-REACTIVE PROTEIN: CRP: 12.2 mg/L — ABNORMAL HIGH (ref <8.0)

## 2020-12-11 LAB — HIV ANTIBODY (ROUTINE TESTING W REFLEX): HIV 1&2 Ab, 4th Generation: NONREACTIVE

## 2020-12-29 ENCOUNTER — Ambulatory Visit: Payer: BC Managed Care – PPO

## 2021-02-05 ENCOUNTER — Telehealth: Payer: Self-pay | Admitting: Certified Nurse Midwife

## 2021-02-05 NOTE — Telephone Encounter (Signed)
Pt would like to have a call back b/c she is experiencing R side abdomen pain that is radiating to her back.  Pt has been scheduled with Deneise Lever on 02/23/2021 but would like to see if she can be seen sooner and also would like for someone from the office can give her call back.

## 2021-02-06 ENCOUNTER — Ambulatory Visit: Payer: BC Managed Care – PPO

## 2021-02-23 ENCOUNTER — Other Ambulatory Visit: Payer: Self-pay

## 2021-02-23 ENCOUNTER — Ambulatory Visit: Admission: RE | Admit: 2021-02-23 | Payer: BC Managed Care – PPO | Source: Ambulatory Visit

## 2021-02-23 ENCOUNTER — Ambulatory Visit: Payer: BC Managed Care – PPO | Admitting: Certified Nurse Midwife

## 2021-02-23 ENCOUNTER — Encounter: Payer: Self-pay | Admitting: Certified Nurse Midwife

## 2021-02-23 VITALS — BP 114/74 | HR 96 | Ht 67.0 in

## 2021-02-23 DIAGNOSIS — R102 Pelvic and perineal pain: Secondary | ICD-10-CM

## 2021-02-23 NOTE — Patient Instructions (Signed)
Pelvic Pain, Female Pelvic pain is pain in your lower belly (abdomen), below your belly button and between your hips. The pain may: Start all of a sudden (be acute). Keep coming back (be recurring). Last a long time (become chronic). Pelvic pain that lasts longer than 6 months is called chronic pelvic pain. There are many causes of pelvic pain. Sometimes the cause of pelvic pain is not known. Follow these instructions at home:  Take over-the-counter and prescription medicines only as told by your doctor. Rest as told by your doctor. Do not have sex if it hurts. Keep a journal of your pelvic pain. Write down: When the pain started. Where the pain is located. What seems to make the pain better or worse, such as food or your monthly period (menstrual cycle). Any symptoms you have along with the pain. Keep all follow-up visits. Contact a doctor if: Medicine does not help your pain, or your pain comes back. You have new symptoms. You have unusual discharge or bleeding from your vagina. You have a fever or chills. You are having trouble pooping (constipation). You have blood in your pee (urine) or poop (stool). Your pee smells bad. You feel weak or light-headed. Get help right away if: You have sudden pain that is very bad. You have very bad pain and also have any of these symptoms: A fever. Feeling like you may vomit (nauseous). Vomiting. Being very sweaty. You faint. These symptoms may be an emergency. Get help right away. Call your local emergency services (911 in the [place] Do not wait to see if the symptoms will go away. Do not drive yourself to the hospital. Summary Pelvic pain is pain in your lower belly (abdomen), below your belly button and between your hips. There are many causes of pelvic pain. Keep a journal of your pelvic pain. This information is not intended to replace advice given to you by your health care provider. Make sure you discuss any questions you have with  your health care provider. Document Revised: 06/10/2020 Document Reviewed: 06/10/2020 Elsevier Patient Education  [place].

## 2021-02-23 NOTE — Progress Notes (Signed)
GYN ENCOUNTER NOTE  Subjective:       Kimberly Cantrell is a 56 y.o. G24P3003 female is here for gynecologic evaluation of the following issues:  1. Right sided pelvic pain that started a few weeks ago and has resolved over the past week.      Gynecologic History No LMP recorded. Patient has had a hysterectomy. Contraception: status post hysterectomy Last Pap: 20012. Results were: normal Last mammogram: 2016. 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   Feb 2020   DDD (degenerative disc disease), cervical    DJD (degenerative joint disease) of cervical spine    Endometriosis    Rheumatoid arthritis ([place])    Vitamin D deficiency     Past Surgical History:  Procedure Laterality Date   ABDOMINAL HYSTERECTOMY     CHOLECYSTECTOMY  2007   HERNIA REPAIR     age 85   NASAL SINUS SURGERY     age 31   OOPHORECTOMY     PARTIAL HYSTERECTOMY     1 ovary remaining    Current Outpatient Medications on File Prior to Visit  Medication Sig Dispense Refill   albuterol (PROVENTIL HFA;VENTOLIN HFA) 108 (90 Base) MCG/ACT inhaler Inhale 2 puffs into the lungs every 6 (six) hours as needed for wheezing or shortness of breath. 1 Inhaler 1   hydroxychloroquine (PLAQUENIL) 200 MG tablet Take 200 mg by mouth as needed.  (Patient not taking: Reported on 12/10/2020)     omeprazole (PRILOSEC) 40 MG capsule Take 1 capsule (40 mg total) by mouth daily. 90 capsule 0   zolpidem (AMBIEN) 10 MG tablet Take 1 tablet (10 mg total) by mouth at bedtime as needed. TAKE 1 TABLET BY MOUTH EVERY DAY 30 tablet 3    No current facility-administered medications on file prior to visit.    Allergies  Allergen Reactions   Metronidazole Other (See Comments)   Clindamycin Hcl Rash   Ketoconazole Rash    Social History   Socioeconomic History   Marital status: Married    Spouse name: Not on file   Number of children: Not on file   Years of education: Not on file   Highest education level: Not on file  Occupational History   Not on file  Tobacco Use   Smoking status: Never   Smokeless tobacco: Never  Vaping Use   Vaping Use: Never used  Substance and Sexual Activity   Alcohol use: No    Alcohol/week: 0.0 standard drinks   Drug use: No   Sexual activity: Yes    Partners: Male    Birth control/protection: Surgical  Other Topics Concern   Not on file  Social History Narrative   Not on file   Social Determinants of Health   Financial Resource Strain: Not on file  Food Insecurity: Not on file  Transportation Needs: Not on file  Physical  Activity: Not on file  Stress: Not on file  Social Connections: Not on file  Intimate Partner Violence: Not on file    Family History  Problem Relation Age of Onset   Heart attack Father    Diabetes Father    Diabetes Maternal Aunt    Diabetes Maternal Uncle    Diabetes Paternal Uncle    Diabetes Maternal Grandmother    Diabetes Paternal Grandmother    Cancer Neg Hx    Ovarian cancer Neg Hx     The following portions of the patient's history were reviewed and updated as appropriate: allergies, current medications, past family history, past medical history, past social history, past surgical history and problem list.  Review of Systems Review of Systems - Negative except as mentioned in HPI Review of Systems - General ROS: negative for - chills, fatigue, fever, hot flashes, malaise or night sweats Hematological and Lymphatic ROS: negative for - bleeding problems or swollen lymph nodes Gastrointestinal ROS: negative for - abdominal pain,  blood in stools, change in bowel habits and nausea/vomiting Musculoskeletal ROS: negative for - joint pain, muscle pain or muscular weakness Genito-Urinary ROS: negative for - change in menstrual cycle, dysmenorrhea, dyspareunia, dysuria, genital discharge, genital ulcers, hematuria, incontinence, irregular/heavy menses, nocturia . Positive for pelvic pain that has now resolved  Objective:   BP 114/74    Pulse 96    Ht 5\' 7"  (1.702 m)    BMI 23.65 kg/m  CONSTITUTIONAL: Well-developed, well-nourished female in no acute distress.  HENT:  Normocephalic, atraumatic.  NECK: Normal range of motion, supple, no masses.  Normal thyroid.  SKIN: Skin is warm and dry. No rash noted. Not diaphoretic. No erythema. No pallor. [place]: Alert and oriented to person, place, and time. PSYCHIATRIC: Normal mood and affect. Normal behavior. Normal judgment and thought content. CARDIOVASCULAR:Not Examined RESPIRATORY: Not Examined BREASTS: Not Examined ABDOMEN: Soft, non distended; Non tender.  No Organomegaly. PELVIC:not done MUSCULOSKELETAL: Normal range of motion. No tenderness.  No cyanosis, clubbing, or edema.     Assessment:  Pelvic pian    Plan:   Discussed potential for ovarian cyst given she has her right ovary . Now that pain resolved likely cyst if it was present has also resolved. Discussed u/s should pain  return. She is in agreement to plan .   Follow up PRN.  Face to face time 10 min.

## 2021-04-09 ENCOUNTER — Telehealth: Payer: Self-pay | Admitting: Certified Nurse Midwife

## 2021-04-09 NOTE — Telephone Encounter (Signed)
Pt called requesting refill on Ambien- confirmed pharmacy as [facility] on [address] . Please advsie.

## 2021-04-15 ENCOUNTER — Other Ambulatory Visit: Payer: Self-pay | Admitting: Certified Nurse Midwife

## 2021-04-15 ENCOUNTER — Other Ambulatory Visit: Payer: Self-pay | Admitting: Obstetrics

## 2021-04-15 MED ORDER — ZOLPIDEM TARTRATE 10 MG PO TABS: 10.0000 mg | ORAL_TABLET | Freq: Every evening | ORAL | 0 refills | Status: DC | PRN

## 2021-05-14 ENCOUNTER — Ambulatory Visit: Payer: BC Managed Care – PPO | Admitting: Obstetrics

## 2021-05-14 ENCOUNTER — Encounter: Payer: Self-pay | Admitting: Obstetrics

## 2021-05-14 VITALS — BP 111/77 | HR 86 | Ht 68.0 in | Wt 154.5 lb

## 2021-05-14 DIAGNOSIS — R399 Unspecified symptoms and signs involving the genitourinary system: Secondary | ICD-10-CM | POA: Diagnosis not present

## 2021-05-14 LAB — POCT URINALYSIS DIPSTICK
Bilirubin, UA: NEGATIVE
Glucose, UA: NEGATIVE
Ketones, UA: NEGATIVE
Leukocytes, UA: NEGATIVE
Nitrite, UA: NEGATIVE
Protein, UA: NEGATIVE
Spec Grav, UA: <=1.005 — AB (ref 1.010–1.025)
Urobilinogen, UA: 0.2 E.U./dL
pH, UA: 7.5 (ref 5.0–8.0)

## 2021-05-14 MED ORDER — ZOLPIDEM TARTRATE 10 MG PO TABS: 10.0000 mg | ORAL_TABLET | Freq: Every evening | ORAL | 0 refills | Status: DC | PRN

## 2021-05-14 NOTE — Progress Notes (Signed)
GYN ENCOUNTER ? ?Encounter for UTI Symptoms ? ?Subjective ? ?HPI: Kimberly Cantrell is a 56 y.o. [phone] who presents today for UTI symptoms. This morning, she had sharp, stabbing pain with urination, chills, and noticed a small amount of blood in her urine. She reports mild suprapubic pain. She denies fever, back pain, nausea, and vomiting. Her symptoms have lessened since this morning. She also would like a refill on her Ambien. ? ?Past Medical History:  ?Diagnosis Date  ? Bone lesion 02/07/2015  ? 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 Feb 2020  ? DDD (degenerative disc disease), cervical   ? DJD (degenerative joint disease) of cervical spine   ? Endometriosis   ? Rheumatoid arthritis ([place])   ? Vitamin D deficiency   ? ?Past Surgical History:  ?Procedure Laterality Date  ? ABDOMINAL HYSTERECTOMY    ? CHOLECYSTECTOMY  2007  ? HERNIA REPAIR    ? age 47  ? NASAL SINUS SURGERY    ? age 31  ? OOPHORECTOMY    ? PARTIAL HYSTERECTOMY    ? 1 ovary remaining  ? ?OB History   ? ? Gravida  ?3  ? Para  ?3  ? Term  ?3  ? Preterm  ?   ? AB  ?   ? Living  ?3  ?  ? ? SAB  ?   ? IAB  ?   ? Ectopic  ?   ? Multiple  ?   ? Live Births  ?3  ?   ?  ?  ? ?Allergies  ?Allergen Reactions  ? Metronidazole Other (See Comments)  ? Clindamycin Hcl Rash  ? Ketoconazole Rash  ? ? ?Negative except as noted in HPI. ?History obtained from the patient ? ?Objective ? ?BP 111/77   Pulse 86   Ht '5\' 8"'$  (1.727 m)   Wt 154 lb 8 oz (70.1 kg)   BMI 23.49 kg/m?  ? ?Urine dip: trace blood, negative leukocytes, negative nitrites ? ?General appearance: alert, cooperative, appears stated age ?Back: No CVA tenderness ?Abdomen: soft, no organomegaly, mildly tender to palpation in suprapubic region ? ?Assessment ?1) Possible UTI ? ?Plan ?1) Urine culture sent. Since her symptoms are currently mild, I encouraged hydration, rest, and AZO for symptoms until results are back. Advised ED if fever, vomiting, or severe pain  develop. ? ? ?Lloyd Huger, CNM ? ? ?  ?

## 2021-05-15 ENCOUNTER — Other Ambulatory Visit: Payer: Self-pay | Admitting: Certified Nurse Midwife

## 2021-05-15 ENCOUNTER — Encounter: Payer: Self-pay | Admitting: Certified Nurse Midwife

## 2021-05-15 MED ORDER — NITROFURANTOIN MONOHYD MACRO 100 MG PO CAPS: 100.0000 mg | ORAL_CAPSULE | Freq: Two times a day (BID) | ORAL | 0 refills | Status: AC

## 2021-05-15 NOTE — Progress Notes (Signed)
Stanley call the after hours nurse line for worsening UTI symptoms. She was seen yesterday in the office and it was noted that she had blood present. Her symptoms were mild yesterday but have intensified today. Orders placed for Macrobid.  ? ?Philip Aspen, CNM  ?

## 2021-05-21 LAB — URINE CULTURE

## 2021-06-24 ENCOUNTER — Other Ambulatory Visit: Payer: Self-pay | Admitting: Obstetrics

## 2021-06-24 ENCOUNTER — Other Ambulatory Visit: Payer: Self-pay | Admitting: Certified Nurse Midwife

## 2021-06-24 ENCOUNTER — Telehealth: Payer: Self-pay | Admitting: Certified Nurse Midwife

## 2021-06-24 ENCOUNTER — Encounter: Payer: Self-pay | Admitting: Certified Nurse Midwife

## 2021-06-24 ENCOUNTER — Telehealth: Payer: Self-pay | Admitting: Obstetrics

## 2021-06-24 MED ORDER — ZOLPIDEM TARTRATE 10 MG PO TABS
10.0000 mg | ORAL_TABLET | Freq: Every evening | ORAL | 4 refills | Status: DC | PRN
Start: 2021-06-24 — End: 2021-11-23

## 2021-06-24 NOTE — Telephone Encounter (Signed)
Patient called and requested a refill on RX ambien. Please advise  ?

## 2021-06-24 NOTE — Telephone Encounter (Signed)
Made in error

## 2021-06-25 NOTE — Telephone Encounter (Signed)
I sent in refill for this medication yesterday  ?

## 2021-08-20 ENCOUNTER — Ambulatory Visit: Payer: BC Managed Care – PPO | Admitting: Nurse Practitioner

## 2021-08-20 ENCOUNTER — Ambulatory Visit
Admission: RE | Admit: 2021-08-20 | Discharge: 2021-08-20 | Disposition: A | Discharge status: Home / Self Care | Payer: BC Managed Care – PPO | Source: Ambulatory Visit | Attending: Nurse Practitioner | Admitting: Nurse Practitioner

## 2021-08-20 ENCOUNTER — Other Ambulatory Visit: Payer: Self-pay

## 2021-08-20 ENCOUNTER — Encounter: Payer: Self-pay | Admitting: Nurse Practitioner

## 2021-08-20 VITALS — BP 118/72 | HR 100 | Temp 98.4°F | Resp 16 | Ht 68.0 in | Wt 154.4 lb

## 2021-08-20 DIAGNOSIS — R1030 Lower abdominal pain, unspecified: Secondary | ICD-10-CM | POA: Diagnosis not present

## 2021-08-20 DIAGNOSIS — R10A1 Flank pain, right side: Secondary | ICD-10-CM

## 2021-08-20 DIAGNOSIS — M6281 Muscle weakness (generalized): Secondary | ICD-10-CM | POA: Diagnosis not present

## 2021-08-20 DIAGNOSIS — R109 Unspecified abdominal pain: Secondary | ICD-10-CM | POA: Insufficient documentation

## 2021-08-20 DIAGNOSIS — M791 Myalgia, unspecified site: Secondary | ICD-10-CM | POA: Diagnosis not present

## 2021-08-20 LAB — POCT URINALYSIS DIPSTICK
Bilirubin, UA: NEGATIVE
Glucose, UA: NEGATIVE
Ketones, UA: NEGATIVE
Leukocytes, UA: NEGATIVE
Nitrite, UA: NEGATIVE
Protein, UA: NEGATIVE
Spec Grav, UA: 1.02 (ref 1.010–1.025)
Urobilinogen, UA: NEGATIVE E.U./dL — AB
pH, UA: 5 (ref 5.0–8.0)

## 2021-08-20 MED ORDER — TAMSULOSIN HCL 0.4 MG PO CAPS: 0.4000 mg | ORAL_CAPSULE | Freq: Every day | ORAL | 0 refills | Status: DC

## 2021-08-20 MED ORDER — KETOROLAC TROMETHAMINE 10 MG PO TABS: 10.0000 mg | ORAL_TABLET | Freq: Four times a day (QID) | ORAL | 0 refills | Status: DC | PRN

## 2021-08-20 NOTE — Progress Notes (Signed)
yellow 

## 2021-08-20 NOTE — Progress Notes (Addendum)
BP 118/72   Pulse 100   Temp 98.4 F (36.9 C) (Oral)   Resp 16   Ht '5\' 8"'$  (1.727 m)   Wt 154 lb 6.4 oz (70 kg)   SpO2 97%   BMI 23.48 kg/m    Subjective:    Patient ID: Kimberly Cantrell, female    DOB: Dec 30, 1965, 56 y.o.   MRN: [id number]  HPI: Kimberly Cantrell is a 56 y.o. female  Chief Complaint  Patient presents with   Back Pain    Low back pain, possible kidney stones. Pain radiates for 2 weeks   Right side flank pain: She says that on Sunday she started having sharp pain in the right flank. Patient denies any recent injury.  She says that she has not noticed any blood.  She has had some urinary frequency. She says she has also noticed some pain in her lower abdomen.  She says she feels like she has a kidney stone. She says the pain comes and goes. Her urine dip did show blood. Negative for nitrates and leukocytes. Ordered ct abdomen pelvis to check for kidney stone.  Will send in prescription for flomax and toradol.   Muscle weakness/pain and brain fog: She says that she has all over muscle weakness and pain.  She says she is leaving things behind and having trouble focusing. She says this all started last Wednesday. She says this is different then her normal RA flare symptoms.  Patient states she recently had a RA flare and saw her rheumatologist.  He gave her a low dose steroid.  She says she has contacted him about the all over muscle weakness and pain and he said  it was not related to her RA.  Will get labs, if labs are normal will refer to Neurology.   Relevant past medical, surgical, family and social history reviewed and updated as indicated. Interim medical history since our last visit reviewed. Allergies and medications reviewed and updated.  Review of Systems  Constitutional: Negative for fever or weight change.  Respiratory: Negative for cough and shortness of breath.   Cardiovascular: Negative for chest pain or palpitations.  Gastrointestinal: positive for  abdominal pain, no bowel changes.  Musculoskeletal: Negative for gait problem or joint swelling. Positive for right flank pain, positive for muscle pain and weakness all over Skin: Negative for rash.  Neurological: Negative for dizziness or headache.  No other specific complaints in a complete review of systems (except as listed in HPI above).      Objective:    BP 118/72   Pulse 100   Temp 98.4 F (36.9 C) (Oral)   Resp 16   Ht '5\' 8"'$  (1.727 m)   Wt 154 lb 6.4 oz (70 kg)   SpO2 97%   BMI 23.48 kg/m   Wt Readings from Last 3 Encounters:  08/20/21 154 lb 6.4 oz (70 kg)  05/14/21 154 lb 8 oz (70.1 kg)  12/10/20 151 lb (68.5 kg)    Physical Exam  Constitutional: Patient appears well-developed and well-nourished.  No distress.  HEENT: head atraumatic, normocephalic, pupils equal and reactive to light, neck supple Cardiovascular: Normal rate, regular rhythm and normal heart sounds.  No murmur heard. No BLE edema. Pulmonary/Chest: Effort normal and breath sounds normal. No respiratory distress. Abdominal: Soft.  There is no tenderness.Right CVA tenderness Psychiatric: Patient has a normal mood and affect. behavior is normal. Judgment and thought content normal.   Results for orders placed or performed in  visit on 08/20/21  POCT urinalysis dipstick  Result Value Ref Range   Color, UA yellow    Clarity, UA clear    Glucose, UA Negative Negative   Bilirubin, UA negative    Ketones, UA negative    Spec Grav, UA 1.020 1.010 - 1.025   Blood, UA small    pH, UA 5.0 5.0 - 8.0   Protein, UA Negative Negative   Urobilinogen, UA negative (A) 0.2 or 1.0 E.U./dL   Nitrite, UA negative    Leukocytes, UA Negative Negative   Appearance clear    Odor none       Assessment & Plan:   Problem List Items Addressed This Visit   None Visit Diagnoses     Right flank pain    -  Primary   Urine dip showed hematuria, will get  ct abdomen pelvis,prescribed  flomax and toradol.   Relevant  Medications   ketorolac (TORADOL) 10 MG tablet   tamsulosin (FLOMAX) 0.4 MG CAPS capsule   Other Relevant Orders   POCT urinalysis dipstick (Completed)   CT Abdomen Pelvis Wo Contrast   Muscle weakness       getting labs, if no underlying cause for muscle weakness and pain will refer to neurology   Relevant Orders   Magnesium   COMPLETE METABOLIC PANEL WITH GFR   CBC with Differential/Platelet   VITAMIN D 25 Hydroxy (Vit-D Deficiency, Fractures)   Vitamin B12   Muscle pain       getting labs, if no underlying cause for muscle weakness and pain will refer to neurology   Lower abdominal pain       Urine dip showed hematuria, will get  ct abdomen pelvis,prescribed  flomax and toradol.   Relevant Orders   CT Abdomen Pelvis Wo Contrast        Follow up plan: Return if symptoms worsen or fail to improve.

## 2021-08-21 ENCOUNTER — Encounter: Payer: Self-pay | Admitting: Nurse Practitioner

## 2021-08-21 ENCOUNTER — Other Ambulatory Visit: Payer: Self-pay | Admitting: Nurse Practitioner

## 2021-08-21 DIAGNOSIS — R109 Unspecified abdominal pain: Secondary | ICD-10-CM

## 2021-08-21 DIAGNOSIS — G8929 Other chronic pain: Secondary | ICD-10-CM

## 2021-08-21 LAB — COMPLETE METABOLIC PANEL WITH GFR
AG Ratio: 1.9 (calc) (ref 1.0–2.5)
ALT: 14 U/L (ref 6–29)
AST: 15 U/L (ref 10–35)
Albumin: 4.3 g/dL (ref 3.6–5.1)
Alkaline phosphatase (APISO): 94 U/L (ref 37–153)
BUN: 12 mg/dL (ref 7–25)
CO2: 24 mmol/L (ref 20–32)
Calcium: 9.7 mg/dL (ref 8.6–10.4)
Chloride: 104 mmol/L (ref 98–110)
Creat: 0.75 mg/dL (ref 0.50–1.03)
Globulin: 2.3 g/dL (calc) (ref 1.9–3.7)
Glucose, Bld: 78 mg/dL (ref 65–99)
Potassium: 3.8 mmol/L (ref 3.5–5.3)
Sodium: 139 mmol/L (ref 135–146)
Total Bilirubin: 0.5 mg/dL (ref 0.2–1.2)
Total Protein: 6.6 g/dL (ref 6.1–8.1)
eGFR: 94 mL/min/{1.73_m2} (ref >=60)

## 2021-08-21 LAB — CBC WITH DIFFERENTIAL/PLATELET
Absolute Monocytes: 447 cells/uL (ref 200–950)
Basophils Absolute: 62 cells/uL (ref 0–200)
Basophils Relative: 0.6 %
Eosinophils Absolute: 156 cells/uL (ref 15–500)
Eosinophils Relative: 1.5 %
HCT: 40.5 % (ref 35.0–45.0)
Hemoglobin: 13.8 g/dL (ref 11.7–15.5)
Lymphs Abs: 2735 cells/uL (ref 850–3900)
MCH: 31.7 pg (ref 27.0–33.0)
MCHC: 34.1 g/dL (ref 32.0–36.0)
MCV: 93.1 fL (ref 80.0–100.0)
MPV: 10.2 fL (ref 7.5–12.5)
Monocytes Relative: 4.3 %
Neutro Abs: 6999 cells/uL (ref 1500–7800)
Neutrophils Relative %: 67.3 %
Platelets: 286 10*3/uL (ref 140–400)
RBC: 4.35 10*6/uL (ref 3.80–5.10)
RDW: 12.2 % (ref 11.0–15.0)
Total Lymphocyte: 26.3 %
WBC: 10.4 10*3/uL (ref 3.8–10.8)

## 2021-08-21 LAB — VITAMIN B12: Vitamin B-12: 209 pg/mL (ref 200–1100)

## 2021-08-21 LAB — MAGNESIUM: Magnesium: 2.2 mg/dL (ref 1.5–2.5)

## 2021-08-21 LAB — VITAMIN D 25 HYDROXY (VIT D DEFICIENCY, FRACTURES): Vit D, 25-Hydroxy: 23 ng/mL — ABNORMAL LOW (ref 30–100)

## 2021-08-21 MED ORDER — CYCLOBENZAPRINE HCL 10 MG PO TABS: 10.0000 mg | ORAL_TABLET | Freq: Three times a day (TID) | ORAL | 0 refills | Status: DC | PRN

## 2021-08-24 NOTE — Telephone Encounter (Signed)
Requested medication (s) are due for refill today no  Requested medication (s) are on the active medication list: yes  Last refill:  08/20/21  Future visit scheduled: yes  Notes to clinic:  Unable to refill per protocol, pharmacy request 90 day supply routing for approval.     Requested Prescriptions  Pending Prescriptions Disp Refills   tamsulosin (FLOMAX) 0.4 MG CAPS capsule [Pharmacy Med Name: TAMSULOSIN HCL 0.4 MG CAPSULE] 90 capsule 1    Sig: TAKE 1 Dean     Urology: Alpha-Adrenergic Blocker Failed - 08/21/2021  9:30 PM      Failed - PSA in normal range and within 360 days    No results found for: "LABPSA", "PSA", "PSA1", "ULTRAPSA"       Passed - Last BP in normal range    BP Readings from Last 1 Encounters:  08/20/21 118/72         Passed - Valid encounter within last 12 months    Recent Outpatient Visits           4 days ago Right flank pain   [facility] Bo Merino, FNP   8 months ago 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[facility])   [facility] [place], Drue Stager, MD   1 year ago Rheumatoid arthritis involving multiple sites, unspecified whether rheumatoid factor present [facility])   [facility] Steele Sizer, MD   1 year ago Allergic reaction to drug, initial encounter   [facility] Delsa Grana, PA-C   2 years ago Rheumatoid arthritis involving multiple sites, unspecified whether rheumatoid factor present [facility])   [facility] Steele Sizer, MD       Future Appointments             In 3 months Ancil Boozer, Drue Stager, MD [facility], [facility]

## 2021-08-26 ENCOUNTER — Telehealth: Payer: Self-pay

## 2021-08-26 NOTE — Telephone Encounter (Signed)
Referral from [place] for right sided low back pain. MRI has been ordered but no PT or ESIs. Schedule with [name] after se has her MRI.

## 2021-08-31 NOTE — Telephone Encounter (Signed)
Please schedule with [name]. 

## 2021-09-07 NOTE — Telephone Encounter (Signed)
Patient declined referral to our office, she said that she prefers to stay with Dr.Jenkins at [facility].

## 2021-10-26 ENCOUNTER — Other Ambulatory Visit: Payer: Self-pay

## 2021-10-26 ENCOUNTER — Ambulatory Visit: Payer: BC Managed Care – PPO | Admitting: Nurse Practitioner

## 2021-10-26 ENCOUNTER — Encounter: Payer: Self-pay | Admitting: Nurse Practitioner

## 2021-10-26 VITALS — BP 122/78 | HR 102 | Temp 98.1°F | Resp 18 | Ht 68.0 in | Wt 147.5 lb

## 2021-10-26 DIAGNOSIS — D1809 Hemangioma of other sites: Secondary | ICD-10-CM

## 2021-10-26 DIAGNOSIS — G8929 Other chronic pain: Secondary | ICD-10-CM

## 2021-10-26 DIAGNOSIS — M5137 Other intervertebral disc degeneration, lumbosacral region: Secondary | ICD-10-CM

## 2021-10-26 DIAGNOSIS — M545 Low back pain, unspecified: Secondary | ICD-10-CM

## 2021-10-26 MED ORDER — GABAPENTIN 300 MG PO CAPS: 300.0000 mg | ORAL_CAPSULE | Freq: Three times a day (TID) | ORAL | 3 refills | Status: AC

## 2021-10-26 NOTE — Progress Notes (Signed)
BP 122/78   Pulse (!) 102   Temp 98.1 F (36.7 C) (Oral)   Resp 18   Ht _0  (1.727 m)   Wt 147 lb 8 oz (66.9 kg)   SpO2 98%   BMI 22.43 kg/m    Subjective:    Patient ID: Kimberly Cantrell, female    DOB: 1965-09-28, 56 y.o.   MRN: [id number]  HPI: Kimberly Cantrell is a 56 y.o. female  Chief Complaint  Patient presents with   Consult   Low back pain/DDD: Patient reports that she has continued to have low back pain for years.  She says that her pain has increased.  Patient reports the pain is sharp and radiates to her right lower back.  She says sometimes it goes up her spine. Patient states she did see pain management at [facility] but did not complete the contract and urine testing. She says she will take care of that after her trip.   She says she is getting ready to go to [place] and is concerned her pain will be uncontrolled during her trip.  Patient reports she has been taking flexeril but it has not been helping.  Patient also reports she is currently taking prednisone 5 mg daily.  Discussed trying gabapentin for pain. Patient instructed to take 300 mg once a day for a couple of days and  may increase to two times a day for a couple of days and then increase to three times a day if needed.  Referral was placed for neurosurgeon at last visit. Patient reports she wants to see Dr. Arnoldo Morale.  She says the referral needs to specify which surgeon.  New referral placed.  Pt had an abnormal MRI of her lumbar spine in 2017 that showed 1. Similar appearance and similar size of the speckled lesion at the  T12 level with enhancement and high T2 signal but primarily reduced  to intermediate T1 signal. This documents 4 months of size  stability. I concur with the prior assessment that this lesion was  only about 7 mm in diameter on 06/18/2011. Given the numerous  surrounding hemangiomas, this may well represent an atypical  hemangioma which has enlarged. It does have a corduroy appearance of   trabeculation which often goes along with hemangioma. Again, nuclear  medicine bone scan might be helpful. Otherwise periodic surveillance  imaging by MRI would be suggested.  2. Spondylosis and degenerative disc disease cause moderate  impingement at C7-T1 ; mild impingement at L4-5 ; and borderline  impingement at L3-4, as detailed above.       Relevant past medical, surgical, family and social history reviewed and updated as indicated. Interim medical history since our last visit reviewed. Allergies and medications reviewed and updated.  Review of Systems  Constitutional: Negative for fever or weight change.  Respiratory: Negative for cough and shortness of breath.   Cardiovascular: Negative for chest pain or palpitations.  Gastrointestinal: Negative for abdominal pain, no bowel changes.  Musculoskeletal: Negative for gait problem or joint swelling. Positive for back pain Skin: Negative for rash.  Neurological: Negative for dizziness or headache.  No other specific complaints in a complete review of systems (except as listed in HPI above).      Objective:    BP 122/78   Pulse (!) 102   Temp 98.1 F (36.7 C) (Oral)   Resp 18   Ht _1  (1.727 m)   Wt 147 lb 8 oz (66.9 kg)  SpO2 98%   BMI 22.43 kg/m   Wt Readings from Last 3 Encounters:  10/26/21 147 lb 8 oz (66.9 kg)  08/20/21 154 lb 6.4 oz (70 kg)  05/14/21 154 lb 8 oz (70.1 kg)    Physical Exam  Constitutional: Patient appears well-developed and well-nourished. No distress.  HEENT: head atraumatic, normocephalic, pupils equal and reactive to light, neck supple Cardiovascular: Normal rate, regular rhythm and normal heart sounds.  No murmur heard. No BLE edema. Pulmonary/Chest: Effort normal and breath sounds normal. No respiratory distress. Abdominal: Soft.  There is no tenderness. Psychiatric: Patient has a normal mood and affect. behavior is normal. Judgment and thought content normal.  Results for orders  placed or performed in visit on 08/20/21  Magnesium  Result Value Ref Range   Magnesium 2.2 1.5 - 2.5 mg/dL  COMPLETE METABOLIC PANEL WITH GFR  Result Value Ref Range   Glucose, Bld 78 65 - 99 mg/dL   BUN 12 7 - 25 mg/dL   Creat 0.75 0.50 - 1.03 mg/dL   eGFR 94 > OR = 60 mL/min/1.1m   BUN/Creatinine Ratio NOT APPLICABLE 6 - 22 (calc)   Sodium 139 135 - 146 mmol/L   Potassium 3.8 3.5 - 5.3 mmol/L   Chloride 104 98 - 110 mmol/L   CO2 24 20 - 32 mmol/L   Calcium 9.7 8.6 - 10.4 mg/dL   Total Protein 6.6 6.1 - 8.1 g/dL   Albumin 4.3 3.6 - 5.1 g/dL   Globulin 2.3 1.9 - 3.7 g/dL (calc)   AG Ratio 1.9 1.0 - 2.5 (calc)   Total Bilirubin 0.5 0.2 - 1.2 mg/dL   Alkaline phosphatase (APISO) 94 37 - 153 U/L   AST 15 10 - 35 U/L   ALT 14 6 - 29 U/L  CBC with Differential/Platelet  Result Value Ref Range   WBC 10.4 3.8 - 10.8 Thousand/uL   RBC 4.35 3.80 - 5.10 Million/uL   Hemoglobin 13.8 11.7 - 15.5 g/dL   HCT 40.5 35.0 - 45.0 %   MCV 93.1 80.0 - 100.0 fL   MCH 31.7 27.0 - 33.0 pg   MCHC 34.1 32.0 - 36.0 g/dL   RDW 12.2 11.0 - 15.0 %   Platelets 286 140 - 400 Thousand/uL   MPV 10.2 7.5 - 12.5 fL   Neutro Abs 6,999 1,500 - 7,800 cells/uL   Lymphs Abs 2,735 850 - 3,900 cells/uL   Absolute Monocytes 447 200 - 950 cells/uL   Eosinophils Absolute 156 15 - 500 cells/uL   Basophils Absolute 62 0 - 200 cells/uL   Neutrophils Relative % 67.3 %   Total Lymphocyte 26.3 %   Monocytes Relative 4.3 %   Eosinophils Relative 1.5 %   Basophils Relative 0.6 %  VITAMIN D 25 Hydroxy (Vit-D Deficiency, Fractures)  Result Value Ref Range   Vit D, 25-Hydroxy 23 (L) 30 - 100 ng/mL  Vitamin B12  Result Value Ref Range   Vitamin B-12 209 200 - 1,100 pg/mL  POCT urinalysis dipstick  Result Value Ref Range   Color, UA yellow    Clarity, UA clear    Glucose, UA Negative Negative   Bilirubin, UA negative    Ketones, UA negative    Spec Grav, UA 1.020 1.010 - 1.025   Blood, UA small    pH, UA 5.0  5.0 - 8.0   Protein, UA Negative Negative   Urobilinogen, UA negative (A) 0.2 or 1.0 E.U./dL   Nitrite, UA negative  Leukocytes, UA Negative Negative   Appearance clear    Odor none       Assessment & Plan:   Problem List Items Addressed This Visit       Musculoskeletal and Integument   DDD (degenerative disc disease), lumbosacral - Primary    Referral placed to neurosurgery.  We will start patient on gabapentin 300 mg.  Take one 300 mg tablet daily for 2 days then may increase to 2 times a day for couple days then increase to 3 times a day if needed.      Relevant Medications   gabapentin (NEURONTIN) 300 MG capsule   Other Relevant Orders   Ambulatory referral to Neurosurgery     Other   Hemangioma of spine    Referral placed to neurosurgery.  We will start patient on gabapentin 300 mg.  Take one 300 mg tablet daily for 2 days then may increase to 2 times a day for couple days then increase to 3 times a day if needed.      Relevant Medications   gabapentin (NEURONTIN) 300 MG capsule   Other Relevant Orders   Ambulatory referral to Neurosurgery   Chronic right-sided low back pain without sciatica    Referral placed to neurosurgery.  We will start patient on gabapentin 300 mg.  Take one 300 mg tablet daily for 2 days then may increase to 2 times a day for couple days then increase to 3 times a day if needed.      Relevant Medications   gabapentin (NEURONTIN) 300 MG capsule   Other Relevant Orders   Ambulatory referral to Neurosurgery     Follow up plan: Return if symptoms worsen or fail to improve.

## 2021-10-26 NOTE — Assessment & Plan Note (Signed)
Referral placed to neurosurgery.  We will start patient on gabapentin 300 mg.  Take one 300 mg tablet daily for 2 days then may increase to 2 times a day for couple days then increase to 3 times a day if needed.

## 2021-10-28 ENCOUNTER — Encounter: Payer: Self-pay | Admitting: Nurse Practitioner

## 2021-10-29 ENCOUNTER — Encounter: Payer: Self-pay | Admitting: Family Medicine

## 2021-10-29 ENCOUNTER — Ambulatory Visit (INDEPENDENT_AMBULATORY_CARE_PROVIDER_SITE_OTHER): Payer: BC Managed Care – PPO | Admitting: Family Medicine

## 2021-10-29 VITALS — BP 122/74 | HR 113 | Resp 16 | Ht 68.0 in | Wt 147.0 lb

## 2021-10-29 DIAGNOSIS — Z1231 Encounter for screening mammogram for malignant neoplasm of breast: Secondary | ICD-10-CM

## 2021-10-29 DIAGNOSIS — M545 Low back pain, unspecified: Secondary | ICD-10-CM

## 2021-10-29 DIAGNOSIS — R000 Tachycardia, unspecified: Secondary | ICD-10-CM

## 2021-10-29 DIAGNOSIS — E538 Deficiency of other specified B group vitamins: Secondary | ICD-10-CM

## 2021-10-29 DIAGNOSIS — G8929 Other chronic pain: Secondary | ICD-10-CM

## 2021-10-29 DIAGNOSIS — Z1211 Encounter for screening for malignant neoplasm of colon: Secondary | ICD-10-CM

## 2021-10-29 DIAGNOSIS — M069 Rheumatoid arthritis, unspecified: Secondary | ICD-10-CM

## 2021-10-29 DIAGNOSIS — R079 Chest pain, unspecified: Secondary | ICD-10-CM | POA: Diagnosis not present

## 2021-10-29 MED ORDER — METAXALONE 800 MG PO TABS: 800.0000 mg | ORAL_TABLET | Freq: Three times a day (TID) | ORAL | 0 refills | Status: DC | PRN

## 2021-10-29 MED ORDER — HYDROCODONE-ACETAMINOPHEN 10-325 MG PO TABS: 1.0000 | ORAL_TABLET | Freq: Three times a day (TID) | ORAL | 0 refills | Status: AC | PRN

## 2021-10-29 NOTE — Progress Notes (Signed)
Name: Kimberly Cantrell   MRN: [id number]    DOB: Aug 12, 1965   Date:10/29/2021       Progress Note  Subjective  Chief Complaint  Chest Pain  HPI  Chest pain: she is going to [place] in 4 days. She states work has been intense and she has been working long hours. She started taking prednisone 5 mg for her RA last week and over the weekend she noticed her heart rate has been elevated and she has been feeling more anxious. Yesterday she developed left sided chest pain while at work , it lasted a long time but resolved now . She is worried due to the upcoming trip this weekend. She is a patient of Dr. Clayborn Bigness. I reached out to him and Delicia White said to send her over now  Chronic low back pain and RA: she is very worried about going to [place] for 3 weeks and not being able to keep up with her family. She states she has taken hydrocodone in the past and just would like a short supply to use while on vacation . She states pain goes up and down on her spine. She will see a neurosurgeon when she returns from the trip. She has taken gabapentin given from [place] but had some side effects that are not as intense now, advised to take it at night only for now   Patient Active Problem List   Diagnosis Date Noted   DDD (degenerative disc disease), lumbosacral 10/26/2021   Chronic right-sided low back pain without sciatica 10/26/2021   RA (rheumatoid arthritis) ([place]) 07/19/2018   Mild intermittent asthma 05/10/2018   CRP elevated 44/62/8638   Cyclic citrullinated peptide (CCP) antibody positive 04/09/2018   Other insomnia 03/06/2018   Vitamin D deficiency 03/03/2018   Mixed hyperlipidemia 03/03/2018   Joint stiffness 03/03/2018   Arthralgia 03/03/2018   Hemangioma of spine 03/03/2018   Blurred vision 03/03/2018   Vaginal atrophy 10/27/2016   Perimenopausal vasomotor symptoms 10/27/2016   Endometriosis 10/07/2015   Bone lesion 02/07/2015    Past Surgical History:  Procedure Laterality Date    ABDOMINAL HYSTERECTOMY     CHOLECYSTECTOMY  2007   HERNIA REPAIR     age 42   NASAL SINUS SURGERY     age 47   OOPHORECTOMY     PARTIAL HYSTERECTOMY     1 ovary remaining    Family History  Problem Relation Age of Onset   Heart attack Father    Diabetes Father    Diabetes Maternal Aunt    Diabetes Maternal Uncle    Diabetes Paternal Uncle    Diabetes Maternal Grandmother    Diabetes Paternal Grandmother    Cancer Neg Hx    Ovarian cancer Neg Hx     Social History   Tobacco Use   Smoking status: Never   Smokeless tobacco: Never  Substance Use Topics   Alcohol use: No    Alcohol/week: 0.0 standard drinks of alcohol     Current Outpatient Medications:    albuterol (PROVENTIL HFA;VENTOLIN HFA) 108 (90 Base) MCG/ACT inhaler, Inhale 2 puffs into the lungs every 6 (six) hours as needed for wheezing or shortness of breath., Disp: 1 Inhaler, Rfl: 1   cyclobenzaprine (FLEXERIL) 10 MG tablet, Take 1 tablet (10 mg total) by mouth 3 (three) times daily as needed for muscle spasms., Disp: 30 tablet, Rfl: 0   gabapentin (NEURONTIN) 300 MG capsule, Take 1 capsule (300 mg total) by mouth 3 (three) times daily.,  Disp: 90 capsule, Rfl: 3   hydroxychloroquine (PLAQUENIL) 200 MG tablet, Take 200 mg by mouth as needed., Disp: , Rfl:    zolpidem (AMBIEN) 10 MG tablet, Take 1 tablet (10 mg total) by mouth at bedtime as needed. TAKE 1 TABLET BY MOUTH EVERY DAY, Disp: 30 tablet, Rfl: 4  Allergies  Allergen Reactions   Covid-19 (Adenovirus) Vaccine Hives   Metronidazole Other (See Comments)   Clindamycin Hcl Rash   Ketoconazole Rash    I personally reviewed active problem list, medication list, allergies, family history, social history, health maintenance with the patient/caregiver today.   ROS  Ten systems reviewed and is negative except as mentioned in HPI+  Objective  Vitals:   10/29/21 0818  BP: 122/74  Pulse: (!) 113  Resp: 16  SpO2: 97%  Weight: 147 lb (66.7 kg)  Height:  $Remove'5\' 8"'tNpfrAl$  (1.727 m)    Body mass index is 22.35 kg/m.  Physical Exam  Constitutional: Patient appears well-developed and well-nourished.  No distress.  HEENT: head atraumatic, normocephalic, pupils equal and reactive to light,  neck supple Cardiovascular: Normal rate, regular rhythm and normal heart sounds.  No murmur heard. No BLE edema. Pulmonary/Chest: Effort normal and breath sounds normal. No respiratory distress. Abdominal: Soft.  There is no tenderness. Muscular Skeletal: pain during palpation of lumbar spine  Psychiatric: Patient has a normal mood and affect. behavior is normal. Judgment and thought content normal.   Recent Results (from the past 2160 hour(s))  POCT urinalysis dipstick     Status: Abnormal   Collection Time: 08/20/21 11:09 AM  Result Value Ref Range   Color, UA yellow    Clarity, UA clear    Glucose, UA Negative Negative   Bilirubin, UA negative    Ketones, UA negative    Spec Grav, UA 1.020 1.010 - 1.025   Blood, UA small    pH, UA 5.0 5.0 - 8.0   Protein, UA Negative Negative   Urobilinogen, UA negative (A) 0.2 or 1.0 E.U./dL   Nitrite, UA negative    Leukocytes, UA Negative Negative   Appearance clear    Odor none   Magnesium     Status: None   Collection Time: 08/20/21 12:01 PM  Result Value Ref Range   Magnesium 2.2 1.5 - 2.5 mg/dL  COMPLETE METABOLIC PANEL WITH GFR     Status: None   Collection Time: 08/20/21 12:01 PM  Result Value Ref Range   Glucose, Bld 78 65 - 99 mg/dL    Comment: .            Fasting reference interval .    BUN 12 7 - 25 mg/dL   Creat 0.75 0.50 - 1.03 mg/dL   eGFR 94 > OR = 60 mL/min/1.73m2    Comment: The eGFR is based on the CKD-EPI 2021 equation. To calculate  the new eGFR from a previous Creatinine or Cystatin C result, go to https://www.kidney.org/professionals/ kdoqi/gfr%5Fcalculator    BUN/Creatinine Ratio NOT APPLICABLE 6 - 22 (calc)   Sodium 139 135 - 146 mmol/L   Potassium 3.8 3.5 - 5.3 mmol/L    Chloride 104 98 - 110 mmol/L   CO2 24 20 - 32 mmol/L   Calcium 9.7 8.6 - 10.4 mg/dL   Total Protein 6.6 6.1 - 8.1 g/dL   Albumin 4.3 3.6 - 5.1 g/dL   Globulin 2.3 1.9 - 3.7 g/dL (calc)   AG Ratio 1.9 1.0 - 2.5 (calc)   Total Bilirubin 0.5 0.2 - 1.2  mg/dL   Alkaline phosphatase (APISO) 94 37 - 153 U/L   AST 15 10 - 35 U/L   ALT 14 6 - 29 U/L  CBC with Differential/Platelet     Status: None   Collection Time: 08/20/21 12:01 PM  Result Value Ref Range   WBC 10.4 3.8 - 10.8 Thousand/uL   RBC 4.35 3.80 - 5.10 Million/uL   Hemoglobin 13.8 11.7 - 15.5 g/dL   HCT 13.1 47.2 - 45.7 %   MCV 93.1 80.0 - 100.0 fL   MCH 31.7 27.0 - 33.0 pg   MCHC 34.1 32.0 - 36.0 g/dL   RDW 11.3 75.7 - 14.5 %   Platelets 286 140 - 400 Thousand/uL   MPV 10.2 7.5 - 12.5 fL   Neutro Abs 6,999 1,500 - 7,800 cells/uL   Lymphs Abs 2,735 850 - 3,900 cells/uL   Absolute Monocytes 447 200 - 950 cells/uL   Eosinophils Absolute 156 15 - 500 cells/uL   Basophils Absolute 62 0 - 200 cells/uL   Neutrophils Relative % 67.3 %   Total Lymphocyte 26.3 %   Monocytes Relative 4.3 %   Eosinophils Relative 1.5 %   Basophils Relative 0.6 %  VITAMIN D 25 Hydroxy (Vit-D Deficiency, Fractures)     Status: Abnormal   Collection Time: 08/20/21 12:01 PM  Result Value Ref Range   Vit D, 25-Hydroxy 23 (L) 30 - 100 ng/mL    Comment: Vitamin D Status         25-OH Vitamin D: . Deficiency:                    <20 ng/mL Insufficiency:             20 - 29 ng/mL Optimal:                 > or = 30 ng/mL . For 25-OH Vitamin D testing on patients on  D2-supplementation and patients for whom quantitation  of D2 and D3 fractions is required, the [facility](TM) 25-OH VIT D, (D2,D3), LC/MS/MS is recommended: order  code 81825 (patients >72yrs). See Note 1 . Note 1 . For additional information, please refer to  http://education.[facility].com/faq/FAQ199  (This link is being provided for informational/ educational purposes  only.)   Vitamin B12     Status: None   Collection Time: 08/20/21 12:01 PM  Result Value Ref Range   Vitamin B-12 209 200 - 1,100 pg/mL    Comment: . Please Note: Although the reference range for vitamin B12 is [phone] pg/mL, it has been reported that between 5 and 10% of patients with values between 200 and 400 pg/mL may experience neuropsychiatric and hematologic abnormalities due to occult B12 deficiency; less than 1% of patients with values above 400 pg/mL will have symptoms. .     PHQ2/9:    10/29/2021    8:18 AM 10/26/2021    2:08 PM 08/20/2021   11:02 AM 12/10/2020    1:40 PM 10/31/2019   10:49 AM  Depression screen PHQ 2/9  Decreased Interest 0 0 1 0 0  Down, Depressed, Hopeless 0 0 0 0 0  PHQ - 2 Score 0 0 1 0 0  Altered sleeping    0   Tired, decreased energy    0   Change in appetite    0   Feeling bad or failure about yourself     0   Trouble concentrating    0   Moving slowly or fidgety/restless  0   Suicidal thoughts    0   PHQ-9 Score    0     phq 9 is negative   Fall Risk:    10/29/2021    8:18 AM 10/26/2021    2:08 PM 08/20/2021   11:02 AM 12/10/2020    1:40 PM 12/10/2019    1:18 PM  Fall Risk   Falls in the past year? 0 0 0 0 0  Number falls in past yr: 0 0 0 0 0  Injury with Fall? 0 0 0 0 0  Risk for fall due to : No Fall Risks   No Fall Risks   Follow up Falls prevention discussed Falls evaluation completed Falls evaluation completed Falls prevention discussed       Functional Status Survey: Is the patient deaf or have difficulty hearing?: No Does the patient have difficulty seeing, even when wearing glasses/contacts?: No Does the patient have difficulty concentrating, remembering, or making decisions?: No Does the patient have difficulty walking or climbing stairs?: Yes Does the patient have difficulty dressing or bathing?: No Does the patient have difficulty doing errands alone such as visiting a doctor's office or shopping?:  No    Assessment & Plan  1. Tachycardia  TSH and CBC will be ordered  2. Chest pain in adult  She will go to Dr. Etta Quill office now   3. B12 deficiency  Return for B12 injection after visit with cardiologist   4. Breast cancer screening by mammogram  - MM 3D SCREEN BREAST BILATERAL; Future  5. Colon cancer screening  - Ambulatory referral to Gastroenterology  6. Chronic right-sided low back pain without sciatica  - metaxalone (SKELAXIN) 800 MG tablet; Take 1 tablet (800 mg total) by mouth 3 (three) times daily as needed for muscle spasms.  Dispense: 90 tablet; Refill: 0  7. Rheumatoid arthritis involving multiple sites, unspecified whether rheumatoid factor present (HCC)  - HYDROcodone-acetaminophen (NORCO) 10-325 MG tablet; Take 1 tablet by mouth every 8 (eight) hours as needed for up to 5 days.  Dispense: 10 tablet; Refill: 0

## 2021-11-22 ENCOUNTER — Other Ambulatory Visit: Payer: Self-pay | Admitting: Certified Nurse Midwife

## 2021-12-10 NOTE — Progress Notes (Signed)
Name: Kimberly Cantrell   MRN: [id number]    DOB: October 22, 1965   Date:12/11/2021       Progress Note  Subjective  Chief Complaint  Follow Up  HPI  Hemangioma spine: incidental finding, she still has mild back pain but no longer taking nsaid;s because it caused increase of GERD and dyspepsia Unchanged   Inflammatory Arthritis, negative RF: seen by Dr. Jefm Bryant, she has not been taking Plaquenil on a regular basis, she states feels stiff usually in am's for about  30 minutes, no redness or increase in warmth. Worse on her hips also has on wrists and hands, she is able to function. She is currently not taking plaquenil , she states when she takes it daily it causes diarrhea and dyspepsia , currently taking prednisone low dose and symptoms are controlled She is trying to control symptoms with hot baths, stretching   Insomnia: takes Ambien to stay asleep since her neck and  back always wakes up up all night. She gets it from gyn   Palpitation: seen by Dr. Clayborn Bigness, heart rate was very high before she went on vacation , she had stress test done before going on vacation in Sep and per patient it was normal, she was given lopressor and within 24 hours she was feeling better, she took for a few days and stopped and no problems since September . She was told likely secondary to stress.   INTERPRETATION  done 11/30/2021  NORMAL LEFT VENTRICULAR SYSTOLIC FUNCTION WITH AN ESTIMATED EF = >55 %  NORMAL RIGHT VENTRICULAR SYSTOLIC FUNCTION  TRIVIAL REGURGITATION NOTED (See above)  NO VALVULAR STENOSIS   Chronic lower back pain/neck pain : she was seen by PA Eliberto Ivory, she has an appointment to see PT, seen by chiropractor, had some massage therapy. She was seen by Dr. Phyllis Ginger in the past for her neck,she is having worsening of symptoms on arms and hands, she denies weakness , we placed referral to Dr. Holley Raring - pain management but she decided not to go. She is taking Gabapentin prn only , she has a referral pending to  see neurosurgeon   Patient Active Problem List   Diagnosis Date Noted   DDD (degenerative disc disease), lumbosacral 10/26/2021   Chronic right-sided low back pain without sciatica 10/26/2021   RA (rheumatoid arthritis) ([place]) 07/19/2018   Mild intermittent asthma 05/10/2018   CRP elevated 25/95/6387   Cyclic citrullinated peptide (CCP) antibody positive 04/09/2018   Other insomnia 03/06/2018   Vitamin D deficiency 03/03/2018   Mixed hyperlipidemia 03/03/2018   Joint stiffness 03/03/2018   Arthralgia 03/03/2018   Hemangioma of spine 03/03/2018   Blurred vision 03/03/2018   Vaginal atrophy 10/27/2016   Perimenopausal vasomotor symptoms 10/27/2016   Endometriosis 10/07/2015   Bone lesion 02/07/2015    Past Surgical History:  Procedure Laterality Date   ABDOMINAL HYSTERECTOMY     CHOLECYSTECTOMY  2007   HERNIA REPAIR     age 56   NASAL SINUS SURGERY     age 56   OOPHORECTOMY     PARTIAL HYSTERECTOMY     1 ovary remaining    Family History  Problem Relation Age of Onset   Heart attack Father    Diabetes Father    Diabetes Maternal Aunt    Diabetes Maternal Uncle    Diabetes Paternal Uncle    Diabetes Maternal Grandmother    Diabetes Paternal Grandmother    Cancer Neg Hx    Ovarian cancer Neg Hx  Social History   Tobacco Use   Smoking status: Never   Smokeless tobacco: Never  Substance Use Topics   Alcohol use: No    Alcohol/week: 0.0 standard drinks of alcohol     Current Outpatient Medications:    albuterol (PROVENTIL HFA;VENTOLIN HFA) 108 (90 Base) MCG/ACT inhaler, Inhale 2 puffs into the lungs every 6 (six) hours as needed for wheezing or shortness of breath., Disp: 1 Inhaler, Rfl: 1   predniSONE (DELTASONE) 5 MG tablet, Take 5 mg by mouth daily with breakfast., Disp: , Rfl:    zolpidem (AMBIEN) 10 MG tablet, TAKE 1 TABLET (10 MG TOTAL) BY MOUTH AT BEDTIME AS NEEDED. TAKE 1 TABLET BY MOUTH EVERY DAY, Disp: 30 tablet, Rfl: 4   gabapentin (NEURONTIN) 300  MG capsule, Take 1 capsule (300 mg total) by mouth 3 (three) times daily. (Patient not taking: Reported on 12/11/2021), Disp: 90 capsule, Rfl: 3   hydroxychloroquine (PLAQUENIL) 200 MG tablet, Take 200 mg by mouth as needed. (Patient not taking: Reported on 12/11/2021), Disp: , Rfl:    metaxalone (SKELAXIN) 800 MG tablet, Take 1 tablet (800 mg total) by mouth 3 (three) times daily as needed for muscle spasms. (Patient not taking: Reported on 12/11/2021), Disp: 90 tablet, Rfl: 0   metoprolol tartrate (LOPRESSOR) 25 MG tablet, Take 12.5 mg by mouth 2 (two) times daily. (Patient not taking: Reported on 12/11/2021), Disp: , Rfl:   Allergies  Allergen Reactions   Covid-19 (Adenovirus) Vaccine Hives   Metronidazole Other (See Comments)   Clindamycin Hcl Rash   Ketoconazole Rash    I personally reviewed active problem list, medication list, allergies, family history, social history, health maintenance with the patient/caregiver today.   ROS  Constitutional: Negative for fever or weight change.  Respiratory: Negative for cough and shortness of breath.   Cardiovascular: Negative for chest pain or palpitations.  Gastrointestinal: Negative for abdominal pain, no bowel changes.  Musculoskeletal: Negative for gait problem or joint swelling.  Skin: Negative for rash.  Neurological: Negative for dizziness or headache.  No other specific complaints in a complete review of systems (except as listed in HPI above).   Objective  Vitals:   12/11/21 1311  BP: 118/70  Pulse: 98  Resp: 16  Temp: 98.2 F (36.8 C)  TempSrc: Oral  SpO2: 98%  Weight: 143 lb 1.6 oz (64.9 kg)  Height: '5\' 8"'$  (1.727 m)    Body mass index is 21.76 kg/m.  Physical Exam  Constitutional: Patient appears well-developed and well-nourished.  No distress.  HEENT: head atraumatic, normocephalic, pupils equal and reactive to light, neck supple Cardiovascular: Normal rate, regular rhythm and normal heart sounds.  No murmur  heard. No BLE edema. Pulmonary/Chest: Effort normal and breath sounds normal. No respiratory distress. Abdominal: Soft.  There is no tenderness. Muscular skeletal: no synovitis  Psychiatric: Patient has a normal mood and affect. behavior is normal. Judgment and thought content normal.   PHQ2/9:    12/11/2021    1:08 PM 10/29/2021    8:18 AM 10/26/2021    2:08 PM 08/20/2021   11:02 AM 12/10/2020    1:40 PM  Depression screen PHQ 2/9  Decreased Interest 0 0 0 1 0  Down, Depressed, Hopeless 0 0 0 0 0  PHQ - 2 Score 0 0 0 1 0  Altered sleeping 0    0  Tired, decreased energy 0    0  Change in appetite 0    0  Feeling bad or failure about  yourself  0    0  Trouble concentrating 0    0  Moving slowly or fidgety/restless 0    0  Suicidal thoughts 0    0  PHQ-9 Score 0    0  Difficult doing work/chores Not difficult at all        phq 9 is negative   Fall Risk:    12/11/2021    1:07 PM 10/29/2021    8:18 AM 10/26/2021    2:08 PM 08/20/2021   11:02 AM 12/10/2020    1:40 PM  Fall Risk   Falls in the past year? 0 0 0 0 0  Number falls in past yr: 0 0 0 0 0  Injury with Fall? 0 0 0 0 0  Risk for fall due to : No Fall Risks No Fall Risks   No Fall Risks  Follow up Falls prevention discussed;Education provided;Falls evaluation completed Falls prevention discussed Falls evaluation completed Falls evaluation completed Falls prevention discussed      Functional Status Survey: Is the patient deaf or have difficulty hearing?: No Does the patient have difficulty seeing, even when wearing glasses/contacts?: No Does the patient have difficulty concentrating, remembering, or making decisions?: No Does the patient have difficulty walking or climbing stairs?: No Does the patient have difficulty dressing or bathing?: No Does the patient have difficulty doing errands alone such as visiting a doctor's office or shopping?: No    Assessment & Plan   1. Rheumatoid arthritis involving multiple  sites, unspecified whether rheumatoid factor present ([place])  Only on prednisone  2. DDD (degenerative disc disease), lumbosacral   3. Hemangioma of spine   4. B12 deficiency  - cyanocobalamin (VITAMIN B12) injection 1,000 mcg  5. DDD (degenerative disc disease), cervical  Going to have PT and has a referral to neurosurgeon  6. Tachycardia  She has lopressor to take prn   7. Screening for colon cancer  - Ambulatory referral to Gastroenterology

## 2021-12-11 ENCOUNTER — Ambulatory Visit (INDEPENDENT_AMBULATORY_CARE_PROVIDER_SITE_OTHER): Payer: BC Managed Care – PPO | Admitting: Family Medicine

## 2021-12-11 ENCOUNTER — Encounter: Payer: Self-pay | Admitting: Family Medicine

## 2021-12-11 VITALS — BP 118/70 | HR 98 | Temp 98.2°F | Resp 16 | Ht 68.0 in | Wt 143.1 lb

## 2021-12-11 DIAGNOSIS — E538 Deficiency of other specified B group vitamins: Secondary | ICD-10-CM

## 2021-12-11 DIAGNOSIS — R000 Tachycardia, unspecified: Secondary | ICD-10-CM

## 2021-12-11 DIAGNOSIS — D1809 Hemangioma of other sites: Secondary | ICD-10-CM | POA: Diagnosis not present

## 2021-12-11 DIAGNOSIS — M069 Rheumatoid arthritis, unspecified: Secondary | ICD-10-CM

## 2021-12-11 DIAGNOSIS — Z1211 Encounter for screening for malignant neoplasm of colon: Secondary | ICD-10-CM

## 2021-12-11 DIAGNOSIS — M5137 Other intervertebral disc degeneration, lumbosacral region: Secondary | ICD-10-CM | POA: Diagnosis not present

## 2021-12-11 DIAGNOSIS — M5030 Other cervical disc degeneration, unspecified cervical region: Secondary | ICD-10-CM

## 2021-12-11 MED ORDER — CYANOCOBALAMIN 1000 MCG/ML IJ SOLN: 1000.0000 ug | Freq: Once | INTRAMUSCULAR | Status: DC

## 2022-02-05 ENCOUNTER — Telehealth: Payer: Self-pay | Admitting: Family Medicine

## 2022-02-05 NOTE — Telephone Encounter (Signed)
Copied from [place] [id number]. Topic: Referral - Status >> Feb 05, 2022  9:40 AM Chapman Fitch wrote: Reason for CRM: pt has an order for a mammogram at [facility] but she would like the order switched to [facility] imaging in [place] / please advise / pt can be seen next week when they get the order

## 2022-02-16 LAB — HM MAMMOGRAPHY

## 2022-02-19 ENCOUNTER — Telehealth: Payer: Self-pay

## 2022-02-19 ENCOUNTER — Ambulatory Visit: Payer: BC Managed Care – PPO | Admitting: Certified Nurse Midwife

## 2022-03-11 DIAGNOSIS — M533 Sacrococcygeal disorders, not elsewhere classified: Secondary | ICD-10-CM | POA: Insufficient documentation

## 2022-03-11 DIAGNOSIS — M47816 Spondylosis without myelopathy or radiculopathy, lumbar region: Secondary | ICD-10-CM | POA: Insufficient documentation

## 2022-03-11 DIAGNOSIS — M5416 Radiculopathy, lumbar region: Secondary | ICD-10-CM | POA: Insufficient documentation

## 2022-05-17 ENCOUNTER — Other Ambulatory Visit: Payer: Self-pay

## 2022-05-17 ENCOUNTER — Other Ambulatory Visit: Payer: Self-pay | Admitting: Licensed Practical Nurse

## 2022-05-17 ENCOUNTER — Telehealth: Payer: Self-pay

## 2022-05-17 ENCOUNTER — Encounter: Payer: Self-pay | Admitting: Licensed Practical Nurse

## 2022-05-17 DIAGNOSIS — G479 Sleep disorder, unspecified: Secondary | ICD-10-CM

## 2022-05-17 MED ORDER — ZOLPIDEM TARTRATE 10 MG PO TABS: 10.0000 mg | ORAL_TABLET | Freq: Every evening | ORAL | 4 refills | Status: DC | PRN

## 2022-05-17 MED ORDER — ZOLPIDEM TARTRATE 10 MG PO TABS: 10.0000 mg | ORAL_TABLET | Freq: Every evening | ORAL | 1 refills | Status: AC | PRN

## 2022-05-17 NOTE — Telephone Encounter (Signed)
Patient calling in to request refill Ambien. Pharmacy is correct in chart

## 2022-05-17 NOTE — Progress Notes (Signed)
Pt requesting refill on Ambien, per chart review has  been on medication for a number of years reports "intermittent use", medication ordered with 1 refill. Mychart message sent recommending sleep specialist.  Roberto Scales, [facility]  05/17/22  4:11 PM

## 2022-05-18 ENCOUNTER — Other Ambulatory Visit: Payer: Self-pay | Admitting: Certified Nurse Midwife

## 2022-05-18 DIAGNOSIS — G479 Sleep disorder, unspecified: Secondary | ICD-10-CM

## 2022-05-25 ENCOUNTER — Other Ambulatory Visit: Payer: Self-pay | Admitting: Certified Nurse Midwife

## 2022-05-25 DIAGNOSIS — G479 Sleep disorder, unspecified: Secondary | ICD-10-CM

## 2022-06-11 ENCOUNTER — Ambulatory Visit: Payer: BC Managed Care – PPO | Admitting: Family Medicine

## 2022-06-11 ENCOUNTER — Ambulatory Visit (INDEPENDENT_AMBULATORY_CARE_PROVIDER_SITE_OTHER): Payer: BC Managed Care – PPO | Admitting: Nurse Practitioner

## 2022-06-11 ENCOUNTER — Encounter: Payer: Self-pay | Admitting: Nurse Practitioner

## 2022-06-11 ENCOUNTER — Other Ambulatory Visit: Payer: Self-pay

## 2022-06-11 VITALS — BP 118/76 | HR 97 | Temp 97.8°F | Resp 16 | Ht 68.0 in | Wt 149.1 lb

## 2022-06-11 DIAGNOSIS — E559 Vitamin D deficiency, unspecified: Secondary | ICD-10-CM

## 2022-06-11 DIAGNOSIS — E538 Deficiency of other specified B group vitamins: Secondary | ICD-10-CM | POA: Diagnosis not present

## 2022-06-11 DIAGNOSIS — Z131 Encounter for screening for diabetes mellitus: Secondary | ICD-10-CM

## 2022-06-11 DIAGNOSIS — Z0000 Encounter for general adult medical examination without abnormal findings: Secondary | ICD-10-CM

## 2022-06-11 DIAGNOSIS — Z1211 Encounter for screening for malignant neoplasm of colon: Secondary | ICD-10-CM

## 2022-06-11 DIAGNOSIS — E785 Hyperlipidemia, unspecified: Secondary | ICD-10-CM

## 2022-06-11 DIAGNOSIS — Z130 Encounter for screening for diseases of the blood and blood-forming organs and certain disorders involving the immune mechanism: Secondary | ICD-10-CM

## 2022-06-11 NOTE — Progress Notes (Signed)
Name: Kimberly Cantrell   MRN: [id number]    DOB: 10-24-1965   Date:06/11/2022       Progress Note  Subjective  Chief Complaint  Chief Complaint  Patient presents with   Annual Exam    HPI  Patient presents for annual CPE.  Diet: well balanced diet Exercise: stretching two times a day  Sleep: 6 - 7 hrs Last dental exam:1-2 years ago  Last eye exam: 2 years ago  [employer] Visit from 06/11/2022 in [facility]  AUDIT-C Score 0      Depression: Phq 9 is  negative    06/11/2022    2:26 PM 12/11/2021    1:08 PM 10/29/2021    8:18 AM 10/26/2021    2:08 PM 08/20/2021   11:02 AM  Depression screen PHQ 2/9  Decreased Interest 0 0 0 0 1  Down, Depressed, Hopeless 0 0 0 0 0  PHQ - 2 Score 0 0 0 0 1  Altered sleeping  0     Tired, decreased energy  0     Change in appetite  0     Feeling bad or failure about yourself   0     Trouble concentrating  0     Moving slowly or fidgety/restless  0     Suicidal thoughts  0     PHQ-9 Score  0     Difficult doing work/chores  Not difficult at all      Hypertension: BP Readings from Last 3 Encounters:  06/11/22 118/76  12/11/21 118/70  10/29/21 122/74   Obesity: Wt Readings from Last 3 Encounters:  06/11/22 149 lb 1.6 oz (67.6 kg)  12/11/21 143 lb 1.6 oz (64.9 kg)  10/29/21 147 lb (66.7 kg)   BMI Readings from Last 3 Encounters:  06/11/22 22.67 kg/m  12/11/21 21.76 kg/m  10/29/21 22.35 kg/m     Vaccines:  HPV: up to at age 7 , ask insurance if age between 78-45  Shingrix: 81-64 yo and ask insurance if covered when patient above 65 yo Pneumonia:  educated and discussed with patient. Flu:  educated and discussed with patient.  Hep C Screening: 12/10/2020 STD testing and prevention (HIV/chl/gon/syphilis): 12/10/2020 Intimate partner violence:none Sexual History : yes Menstrual History/LMP/Abnormal Bleeding: hysterectomy Incontinence Symptoms: none  Breast cancer:  - Last Mammogram:  02/01/2015, she says that she had it done in January of this year.  - BRCA gene screening: none  Osteoporosis: Discussed high calcium and vitamin D supplementation, weight bearing exercises  Cervical cancer screening: hysterectomy  Skin cancer: Discussed monitoring for atypical lesions  Colorectal cancer: 04/16/2011, she is due   Lung cancer:   Low Dose CT Chest recommended if Age 44-80 years, 20 pack-year currently smoking OR have quit w/in 15years. Patient does not qualify.   ECG: 12/20/2010  Advanced Care Planning: A voluntary discussion about advance care planning including the explanation and discussion of advance directives.  Discussed health care proxy and Living will, and the patient was able to identify a health care proxy as husband.  Patient does not have a living will at present time. If patient does have living will, I have requested they bring this to the clinic to be scanned in to their chart.  Lipids: Lab Results  Component Value Date   CHOL 228 (H) 12/10/2020   CHOL 253 (H) 02/28/2019   CHOL 235 (H) 04/06/2018   Lab Results  Component Value Date   HDL 46 (L)  12/10/2020   HDL 52 02/28/2019   HDL 53 04/06/2018   Lab Results  Component Value Date   LDLCALC 129 (H) 12/10/2020   LDLCALC 159 (H) 02/28/2019   LDLCALC 153 (H) 04/06/2018   Lab Results  Component Value Date   TRIG 369 (H) 12/10/2020   TRIG 228 (H) 02/28/2019   TRIG 154 (H) 04/06/2018   Lab Results  Component Value Date   CHOLHDL 5.0 (H) 12/10/2020   CHOLHDL 4.9 (H) 02/28/2019   CHOLHDL 4.4 04/06/2018   No results found for: "LDLDIRECT"  Glucose: Glucose  Date Value Ref Range Status  01/12/2012 95 65 - 99 mg/dL Final   Glucose, Bld  Date Value Ref Range Status  08/20/2021 78 65 - 99 mg/dL Final    Comment:    .            Fasting reference interval .   12/10/2020 86 65 - 99 mg/dL Final    Comment:    .            Fasting reference interval .   05/09/2019 85 65 - 99 mg/dL Final     Comment:    .            Fasting reference interval .     Patient Active Problem List   Diagnosis Date Noted   Arthropathy of lumbar facet joint 03/11/2022   Lumbar radiculopathy 03/11/2022   Sacroiliac joint pain 03/11/2022   DDD (degenerative disc disease), lumbosacral 10/26/2021   Chronic right-sided low back pain without sciatica 10/26/2021   RA (rheumatoid arthritis) (HCC) 07/19/2018   Mild intermittent asthma 05/10/2018   CRP elevated 04/18/2018   Cyclic citrullinated peptide (CCP) antibody positive 04/09/2018   Other insomnia 03/06/2018   Vitamin D deficiency 03/03/2018   Mixed hyperlipidemia 03/03/2018   Joint stiffness 03/03/2018   Arthralgia 03/03/2018   Hemangioma of spine 03/03/2018   Blurred vision 03/03/2018   Vaginal atrophy 10/27/2016   Perimenopausal vasomotor symptoms 10/27/2016   Endometriosis 10/07/2015   Bone lesion 02/07/2015    Past Surgical History:  Procedure Laterality Date   ABDOMINAL HYSTERECTOMY     CHOLECYSTECTOMY  2007   HERNIA REPAIR     age 17   NASAL SINUS SURGERY     age 85   OOPHORECTOMY     PARTIAL HYSTERECTOMY     1 ovary remaining    Family History  Problem Relation Age of Onset   Heart attack Father    Diabetes Father    Diabetes Maternal Aunt    Diabetes Maternal Uncle    Diabetes Paternal Uncle    Diabetes Maternal Grandmother    Diabetes Paternal Grandmother    Cancer Neg Hx    Ovarian cancer Neg Hx     Social History   Socioeconomic History   Marital status: Married    Spouse name: Not on file   Number of children: 3   Years of education: Not on file   Highest education level: Not on file  Occupational History   Not on file  Tobacco Use   Smoking status: Never   Smokeless tobacco: Never  Vaping Use   Vaping Use: Never used  Substance and Sexual Activity   Alcohol use: No    Alcohol/week: 0.0 standard drinks of alcohol   Drug use: No   Sexual activity: Yes    Partners: Male    Birth  control/protection: Surgical  Other Topics Concern   Not on file  Social  History Narrative   Not on file   Social Determinants of Health   Financial Resource Strain: Low Risk  (06/11/2022)   Overall Financial Resource Strain (CARDIA)    Difficulty of Paying Living Expenses: Not hard at all  Food Insecurity: No Food Insecurity (06/11/2022)   Hunger Vital Sign    Worried About Running Out of Food in the Last Year: Never true    Ran Out of Food in the Last Year: Never true  Transportation Needs: No Transportation Needs (06/11/2022)   PRAPARE - Administrator, Civil Service (Medical): No    Lack of Transportation (Non-Medical): No  Physical Activity: Insufficiently Active (06/11/2022)   Exercise Vital Sign    Days of Exercise per Week: 7 days    Minutes of Exercise per Session: 20 min  Stress: No Stress Concern Present (06/11/2022)   [employer] of Occupational Health - Occupational Stress Questionnaire    Feeling of Stress : Only a little  Social Connections: Socially Integrated (06/11/2022)   Social Connection and Isolation Panel [NHANES]    Frequency of Communication with Friends and Family: More than three times a week    Frequency of Social Gatherings with Friends and Family: More than three times a week    Attends Religious Services: More than 4 times per year    Active Member of [employer] or Organizations: Yes    Attends Banker Meetings: Never    Marital Status: Married  Catering manager Violence: Not At Risk (06/11/2022)   Humiliation, Afraid, Rape, and Kick questionnaire    Fear of Current or Ex-Partner: No    Emotionally Abused: No    Physically Abused: No    Sexually Abused: No     Current Outpatient Medications:    albuterol (PROVENTIL HFA;VENTOLIN HFA) 108 (90 Base) MCG/ACT inhaler, Inhale 2 puffs into the lungs every 6 (six) hours as needed for wheezing or shortness of breath., Disp: 1 Inhaler, Rfl: 1   gabapentin (NEURONTIN) 300 MG capsule,  Take 1 capsule (300 mg total) by mouth 3 (three) times daily., Disp: 90 capsule, Rfl: 3   predniSONE (DELTASONE) 5 MG tablet, Take 5 mg by mouth daily with breakfast., Disp: , Rfl:    zolpidem (AMBIEN) 10 MG tablet, Take 1 tablet (10 mg total) by mouth at bedtime as needed. TAKE 1 TABLET BY MOUTH EVERY DAY, Disp: 30 tablet, Rfl: 1  Current Facility-Administered Medications:    cyanocobalamin (VITAMIN B12) injection 1,000 mcg, 1,000 mcg, Intramuscular, Once, Sowles, Danna Hefty, MD  Allergies  Allergen Reactions   Covid-19 (Adenovirus) Vaccine Hives   Metronidazole Other (See Comments)   Clindamycin Hcl Rash   Ketoconazole Rash     ROS  Constitutional: Negative for fever or weight change.  Respiratory: Negative for cough and shortness of breath.   Cardiovascular: Negative for chest pain or palpitations.  Gastrointestinal: Negative for abdominal pain, no bowel changes.  Musculoskeletal: Negative for gait problem or joint swelling.  Skin: Negative for rash.  Neurological: Negative for dizziness or headache.  No other specific complaints in a complete review of systems (except as listed in HPI above).   Objective  Vitals:   06/11/22 1426  BP: 118/76  Pulse: 97  Resp: 16  Temp: 97.8 F (36.6 C)  TempSrc: Oral  SpO2: 98%  Weight: 149 lb 1.6 oz (67.6 kg)  Height: 5\' 8"  (1.727 m)    Body mass index is 22.67 kg/m.  Physical Exam Constitutional: Patient appears well-developed and well-nourished. No distress.  HENT: Head: Normocephalic and atraumatic. Ears: B TMs ok, no erythema or effusion; Nose: Nose normal. Mouth/Throat: Oropharynx is clear and moist. No oropharyngeal exudate.  Eyes: Conjunctivae and EOM are normal. Pupils are equal, round, and reactive to light. No scleral icterus.  Neck: Normal range of motion. Neck supple. No JVD present. No thyromegaly present.  Cardiovascular: Normal rate, regular rhythm and normal heart sounds.  No murmur heard. No BLE  edema. Pulmonary/Chest: Effort normal and breath sounds normal. No respiratory distress. Abdominal: Soft. Bowel sounds are normal, no distension. There is no tenderness. no masses Breast: no lumps or masses, no nipple discharge or rashes Musculoskeletal: Normal range of motion, no joint effusions. No gross deformities Neurological: he is alert and oriented to person, place, and time. No cranial nerve deficit. Coordination, balance, strength, speech and gait are normal.  Skin: Skin is warm and dry. No rash noted. No erythema.  Psychiatric: Patient has a normal mood and affect. behavior is normal. Judgment and thought content normal.   No results found for this or any previous visit (from the past 2160 hour(s)).   Fall Risk:    06/11/2022    2:25 PM 12/11/2021    1:07 PM 10/29/2021    8:18 AM 10/26/2021    2:08 PM 08/20/2021   11:02 AM  Fall Risk   Falls in the past year? 0 0 0 0 0  Number falls in past yr: 0 0 0 0 0  Injury with Fall? 0 0 0 0 0  Risk for fall due to :  No Fall Risks No Fall Risks    Follow up  Falls prevention discussed;Education provided;Falls evaluation completed Falls prevention discussed Falls evaluation completed Falls evaluation completed     Functional Status Survey: Is the patient deaf or have difficulty hearing?: No Does the patient have difficulty seeing, even when wearing glasses/contacts?: No Does the patient have difficulty concentrating, remembering, or making decisions?: No Does the patient have difficulty walking or climbing stairs?: No Does the patient have difficulty dressing or bathing?: No Does the patient have difficulty doing errands alone such as visiting a doctor's office or shopping?: No   Assessment & Plan  1. Annual physical exam  - CBC with Differential/Platelet - COMPLETE METABOLIC PANEL WITH GFR - Lipid panel - Hemoglobin A1c - Vitamin B12 - VITAMIN D 25 Hydroxy (Vit-D Deficiency, Fractures) - TSH  2. Vitamin D deficiency  -  VITAMIN D 25 Hydroxy (Vit-D Deficiency, Fractures)  3. B12 deficiency  - Vitamin B12  4. Screening for diabetes mellitus  - COMPLETE METABOLIC PANEL WITH GFR - Hemoglobin A1c  5. Dyslipidemia  - Lipid panel  6. Screening for deficiency anemia  - CBC with Differential/Platelet  7. Screening for colon cancer  - Ambulatory referral to Gastroenterology  Patient reports she is going to schedule in [place]  -USPSTF grade A and B recommendations reviewed with patient; age-appropriate recommendations, preventive care, screening tests, etc discussed and encouraged; healthy living encouraged; see AVS for patient education given to patient -Discussed importance of 150 minutes of physical activity weekly, eat two servings of fish weekly, eat one serving of tree nuts ( cashews, pistachios, pecans, almonds.Marland Kitchen) every other day, eat 6 servings of fruit/vegetables daily and drink plenty of water and avoid sweet beverages.

## 2022-06-12 LAB — COMPLETE METABOLIC PANEL WITH GFR
AG Ratio: 2.3 (calc) (ref 1.0–2.5)
ALT: 14 U/L (ref 6–29)
AST: 14 U/L (ref 10–35)
Albumin: 4.5 g/dL (ref 3.6–5.1)
Alkaline phosphatase (APISO): 98 U/L (ref 37–153)
BUN: 11 mg/dL (ref 7–25)
CO2: 26 mmol/L (ref 20–32)
Calcium: 9.7 mg/dL (ref 8.6–10.4)
Chloride: 105 mmol/L (ref 98–110)
Creat: 0.69 mg/dL (ref 0.50–1.03)
Globulin: 2 g/dL (calc) (ref 1.9–3.7)
Glucose, Bld: 91 mg/dL (ref 65–99)
Potassium: 3.7 mmol/L (ref 3.5–5.3)
Sodium: 140 mmol/L (ref 135–146)
Total Bilirubin: 0.4 mg/dL (ref 0.2–1.2)
Total Protein: 6.5 g/dL (ref 6.1–8.1)
eGFR: 102 mL/min/{1.73_m2} (ref >=60)

## 2022-06-12 LAB — CBC WITH DIFFERENTIAL/PLATELET
Absolute Monocytes: 451 cells/uL (ref 200–950)
Basophils Absolute: 74 cells/uL (ref 0–200)
Basophils Relative: 0.8 %
Eosinophils Absolute: 193 cells/uL (ref 15–500)
Eosinophils Relative: 2.1 %
HCT: 39.4 % (ref 35.0–45.0)
Hemoglobin: 13.8 g/dL (ref 11.7–15.5)
Lymphs Abs: 2953 cells/uL (ref 850–3900)
MCH: 31.3 pg (ref 27.0–33.0)
MCHC: 35 g/dL (ref 32.0–36.0)
MCV: 89.3 fL (ref 80.0–100.0)
MPV: 10.1 fL (ref 7.5–12.5)
Monocytes Relative: 4.9 %
Neutro Abs: 5529 cells/uL (ref 1500–7800)
Neutrophils Relative %: 60.1 %
Platelets: 316 10*3/uL (ref 140–400)
RBC: 4.41 10*6/uL (ref 3.80–5.10)
RDW: 12.4 % (ref 11.0–15.0)
Total Lymphocyte: 32.1 %
WBC: 9.2 10*3/uL (ref 3.8–10.8)

## 2022-06-12 LAB — TSH: TSH: 2.89 mIU/L (ref 0.40–4.50)

## 2022-06-12 LAB — HEMOGLOBIN A1C
Hgb A1c MFr Bld: 5.2 % of total Hgb (ref <5.7)
Mean Plasma Glucose: 103 mg/dL
eAG (mmol/L): 5.7 mmol/L

## 2022-06-12 LAB — LIPID PANEL
Cholesterol: 243 mg/dL — ABNORMAL HIGH (ref <200)
HDL: 57 mg/dL (ref >=50)
LDL Cholesterol (Calc): 151 mg/dL (calc) — ABNORMAL HIGH
Non-HDL Cholesterol (Calc): 186 mg/dL (calc) — ABNORMAL HIGH (ref <130)
Total CHOL/HDL Ratio: 4.3 (calc) (ref <5.0)
Triglycerides: 211 mg/dL — ABNORMAL HIGH (ref <150)

## 2022-06-15 ENCOUNTER — Encounter: Payer: BC Managed Care – PPO | Admitting: Family Medicine

## 2022-06-25 ENCOUNTER — Encounter: Payer: Self-pay | Admitting: Family Medicine

## 2022-11-11 ENCOUNTER — Telehealth: Payer: Self-pay | Admitting: Gastroenterology

## 2022-11-11 NOTE — Telephone Encounter (Signed)
Patient called in to schedule appointment with Dr. Servando Snare but she had an consult visit with [facility] on 02/22/22. The last time she seen Dr. Servando Snare was 07/19/2019. I informed her she will have to call [facility] because she is now established there when them. She became there patient when she went to the consult. I informed her if she didn't go to the consult I would have been able to schedule her. The reason for the call was because [facility] appointments was schedule to far out and she needed a sooner appointment.The patient asked was [facility] still at the practice I advised her she was I even offered to transfer her to [facility] she stated that  that she was fine.

## 2022-11-19 ENCOUNTER — Other Ambulatory Visit: Payer: Self-pay | Admitting: Family Medicine

## 2022-11-19 DIAGNOSIS — Z1211 Encounter for screening for malignant neoplasm of colon: Secondary | ICD-10-CM

## 2022-11-19 DIAGNOSIS — Z1212 Encounter for screening for malignant neoplasm of rectum: Secondary | ICD-10-CM

## 2022-11-23 ENCOUNTER — Other Ambulatory Visit: Payer: Self-pay | Admitting: Gastroenterology

## 2022-11-23 DIAGNOSIS — R1084 Generalized abdominal pain: Secondary | ICD-10-CM

## 2022-11-23 DIAGNOSIS — R1031 Right lower quadrant pain: Secondary | ICD-10-CM

## 2022-11-23 DIAGNOSIS — K625 Hemorrhage of anus and rectum: Secondary | ICD-10-CM

## 2022-12-01 ENCOUNTER — Ambulatory Visit
Admission: RE | Admit: 2022-12-01 | Discharge: 2022-12-01 | Disposition: A | Discharge status: Home / Self Care | Payer: BC Managed Care – PPO | Source: Ambulatory Visit | Attending: Gastroenterology | Admitting: Gastroenterology

## 2022-12-01 DIAGNOSIS — R1084 Generalized abdominal pain: Secondary | ICD-10-CM | POA: Insufficient documentation

## 2022-12-01 DIAGNOSIS — R1031 Right lower quadrant pain: Secondary | ICD-10-CM | POA: Insufficient documentation

## 2022-12-01 DIAGNOSIS — K625 Hemorrhage of anus and rectum: Secondary | ICD-10-CM | POA: Insufficient documentation

## 2023-02-02 ENCOUNTER — Ambulatory Visit: Payer: Self-pay | Admitting: *Deleted

## 2023-02-02 NOTE — Telephone Encounter (Signed)
  Chief Complaint: chronic UTI-pain Symptoms: pain/burning with urination, urgency, flank pain  Frequency: 2 days Pertinent Negatives: Patient denies fever Disposition: [] ED /[] Urgent Care (no appt availability in office) / [x] Appointment(In office/virtual)/ []  [place] [place] [facility]/ [] Home Care/ [] Refused Recommended Disposition /[] [facility]/ []  Follow-up with PCP Additional Notes: Patient is concerned about chronic UTI and need for urology referral. Patient experiencing symptoms for 2 days-found open appointment in office- was able to move patient to sooner appointment.    Reason for Disposition  Side (flank) or lower back pain present  Answer Assessment - Initial Assessment Questions 1. SEVERITY: "How bad is the pain?"  (e.g., Scale 1-10; mild, moderate, or severe)   - MILD (1-3): complains slightly about urination hurting   - MODERATE (4-7): interferes with normal activities     - SEVERE (8-10): excruciating, unwilling or unable to urinate because of the pain      moderate  3. PATTERN: "Is pain present every time you urinate or just sometimes?"      Every time- burning 4. ONSET: "When did the painful urination start?"      2 days ago 5. FEVER: "Do you have a fever?" If Yes, ask: "What is your temperature, how was it measured, and when did it start?"     no 6. PAST UTI: "Have you had a urine infection before?" If Yes, ask: "When was the last time?" and "What happened that time?"      Yes- chronic 7. CAUSE: "What do you think is causing the painful urination?"  (e.g., UTI, scratch, Herpes sore)     UTI 8. OTHER SYMPTOMS: "Do you have any other symptoms?" (e.g., blood in urine, flank pain, genital sores, urgency, vaginal discharge)     Flank pain  Protocols used: Urination Pain - Female-A-AH

## 2023-02-02 NOTE — Telephone Encounter (Signed)
Summary: Possible uti, burning, pain   The patient called stating she has a possible uti. The patient complains of burning, pressure and some pain in her flank and bladder. She states this has been going on for 2 days now. She says this has been a chronic issue and may need a referral to a urologist. She has an appt on Friday with [facility] as [facility] has nothing until 12/23. It has been ok'd by Okey Regal at [facility]. Please assist patient further          Attempted to reach pt, VM full,unable to leave message to call back.

## 2023-02-03 ENCOUNTER — Encounter: Payer: Self-pay | Admitting: Physician Assistant

## 2023-02-03 ENCOUNTER — Ambulatory Visit: Payer: BC Managed Care – PPO | Admitting: Physician Assistant

## 2023-02-03 VITALS — BP 118/76 | HR 70 | Resp 16 | Ht 68.0 in | Wt 154.0 lb

## 2023-02-03 DIAGNOSIS — N3000 Acute cystitis without hematuria: Secondary | ICD-10-CM | POA: Diagnosis not present

## 2023-02-03 DIAGNOSIS — R300 Dysuria: Secondary | ICD-10-CM | POA: Diagnosis not present

## 2023-02-03 LAB — POCT URINALYSIS DIPSTICK
Appearance: NORMAL
Bilirubin, UA: NEGATIVE
Blood, UA: NEGATIVE
Glucose, UA: NEGATIVE
Ketones, UA: NEGATIVE
Nitrite, UA: NEGATIVE
Protein, UA: NEGATIVE
Spec Grav, UA: 1.015 (ref 1.010–1.025)
Urobilinogen, UA: 0.2 U/dL
pH, UA: 6 (ref 5.0–8.0)

## 2023-02-03 MED ORDER — SULFAMETHOXAZOLE-TRIMETHOPRIM 800-160 MG PO TABS: 1.0000 | ORAL_TABLET | Freq: Two times a day (BID) | ORAL | 0 refills | Status: AC

## 2023-02-03 NOTE — Progress Notes (Signed)
Acute Office Visit   Patient: Kimberly Cantrell   DOB: Mar 25, 1965   57 y.o. Female  MRN: [id number] Visit Date: 02/03/2023  Today's healthcare provider: Oswaldo Conroy Ralphael Southgate, PA-C  Introduced myself to the patient as a Secondary school teacher and provided education on APPs in clinical practice.    Chief Complaint  Patient presents with   Dysuria    X4 days. On/off UTI since Sept. Would like Urology referral.   Subjective    HPI HPI     Dysuria    Additional comments: X4 days. On/off UTI since Sept. Would like Urology referral.      Last edited by Dollene Primrose, CMA on 02/03/2023  8:35 AM.      She reports she is concerned she has  a UTI She reports this is her fourth one since Sept 2024  States symptoms started on Monday and she is having right-sided flank pain with radiation into the right lower abdomen  She reports some malodorous urine as well as suprapubic pain   She reports Macrobid is very rough on her stomach and would prefer alternative  She denies recent sexual activity since she has been getting UTIs    She is interested in a Urology referral today - would like to go to [facility]    Medications: Outpatient Medications Prior to Visit  Medication Sig   albuterol (PROVENTIL HFA;VENTOLIN HFA) 108 (90 Base) MCG/ACT inhaler Inhale 2 puffs into the lungs every 6 (six) hours as needed for wheezing or shortness of breath.   gabapentin (NEURONTIN) 300 MG capsule Take 1 capsule (300 mg total) by mouth 3 (three) times daily.   predniSONE (DELTASONE) 5 MG tablet Take 5 mg by mouth daily with breakfast.   zolpidem (AMBIEN) 10 MG tablet Take 1 tablet (10 mg total) by mouth at bedtime as needed. TAKE 1 TABLET BY MOUTH EVERY DAY   [DISCONTINUED] cyanocobalamin (VITAMIN B12) injection 1,000 mcg    No facility-administered medications prior to visit.    Review of Systems  Constitutional:  Negative for chills, fatigue and fever.  Gastrointestinal:  Positive for abdominal pain.  Genitourinary:   Positive for dysuria, flank pain and vaginal pain. Negative for hematuria, vaginal bleeding and vaginal discharge.        Objective    BP 118/76   Pulse 70   Resp 16   Ht 5\' 8"  (1.727 m)   Wt 154 lb (69.9 kg)   SpO2 98%   BMI 23.42 kg/m     Physical Exam Vitals reviewed.  Constitutional:      General: She is awake.     Appearance: Normal appearance. She is well-developed, well-groomed and normal weight.  HENT:     Head: Normocephalic and atraumatic.  Eyes:     General: Lids are normal. Gaze aligned appropriately.     Extraocular Movements: Extraocular movements intact.     Conjunctiva/sclera: Conjunctivae normal.  Pulmonary:     Effort: Pulmonary effort is normal.  Neurological:     General: No focal deficit present.     Mental Status: She is alert and oriented to person, place, and time.     GCS: GCS eye subscore is 4. GCS verbal subscore is 5. GCS motor subscore is 6.     Cranial Nerves: No cranial nerve deficit, dysarthria or facial asymmetry.  Psychiatric:        Attention and Perception: Attention and perception normal.        Mood  and Affect: Mood and affect normal.        Speech: Speech normal.        Behavior: Behavior normal. Behavior is cooperative.       Results for orders placed or performed in visit on 02/03/23  POCT urinalysis dipstick  Result Value Ref Range   Color, UA Yellow    Clarity, UA Clear    Glucose, UA Negative Negative   Bilirubin, UA Negative    Ketones, UA Negative    Spec Grav, UA 1.015 1.010 - 1.025   Blood, UA Negative    pH, UA 6.0 5.0 - 8.0   Protein, UA Negative Negative   Urobilinogen, UA 0.2 0.2 or 1.0 E.U./dL   Nitrite, UA Negative    Leukocytes, UA Trace (A) Negative   Appearance Normal    Odor None     Assessment & Plan      No follow-ups on file.      Problem List Items Addressed This Visit   None Visit Diagnoses       Acute cystitis without hematuria    -  Primary   Relevant Medications    sulfamethoxazole-trimethoprim (BACTRIM DS) 800-160 MG tablet   Other Relevant Orders   Ambulatory referral to Urology     Dysuria       Relevant Orders   Urine Culture   POCT urinalysis dipstick (Completed)      Acute, new problem Patient reports symptoms comprised of the following: dysuria, increased urinary frequency, flank pain, suprapubic pain  for 4 days  Results of UA are consistent with UTI - urine sample sent for culture to determine causative organism and susceptibility- results to dictate further management  Urine dip results reviewed with patient during apt  She reports this is her 4th UTI since Sept 2024 and would like referral to Urology for evaluation  Will provide script for Bactrim PO BID x 5 days  - discussed importance of finishing entire course of abx and staying well hydrated while recovering from UTI She declines Pyridium today  Reviewed ED precautions with patient Follow up as needed for persistent or worsening symptoms   No follow-ups on file.   I, Tiphany Fayson E Anju Sereno, PA-C, have reviewed all documentation for this visit. The documentation on 02/03/23 for the exam, diagnosis, procedures, and orders are all accurate and complete.   Jacquelin Hawking, MHS, PA-C [facility]

## 2023-02-03 NOTE — Patient Instructions (Signed)
Based on your symptoms and results of the urinalysis I believe you have a UTI I recommend the following:  I have sent in a script for Bactrim to be taken by mouth twice per day for 5 days    Please finish the entire course of the antibiotic even if you are feeling better before it is completed. We will keep you updated on the results of your urine culture once it returns and if any changes need to be made to your antibiotic regimen  Stay well hydrated (at least 75 oz of water per day) and avoid holding your urine If you have any of the following please let us know: persistent symptoms, fever, trouble urinating or inability to urinate, confusion, flank pain.

## 2023-02-04 ENCOUNTER — Ambulatory Visit: Payer: BC Managed Care – PPO | Admitting: Family Medicine

## 2023-02-05 LAB — URINE CULTURE
MICRO NUMBER:: 15876217
Result:: NO GROWTH
SPECIMEN QUALITY:: ADEQUATE

## 2023-02-05 NOTE — Progress Notes (Signed)
Your urine culture was negative for bacterial growth.  At this time I recommend stopping the Bactrim as it is not indicated.

## 2023-02-17 ENCOUNTER — Ambulatory Visit: Payer: Self-pay

## 2023-02-17 NOTE — Telephone Encounter (Signed)
 Chief Complaint: Chest pain - awoken this morning at 4am. Pain stayed constant for 1 hour. Has had several other occurences today Symptoms: chest pain, rash, pain in lower right quadrant. Frequency: ongoing Pertinent Negatives: Patient denies  Disposition: [x] ED /[] Urgent Care (no appt availability in office) / [] Appointment(In office/virtual)/ []  [facility]/ [] Home Care/ [] Refused Recommended Disposition /[] [facility]/ []  Follow-up with PCP Additional Notes: Pt was transferred for rash. Pt thinks that the rash was caused by ABX prescribed for UTI. Pt has d/c'd ABX, but rash continues. Pt mentioned lower right quadrant abdominal pain which she believes is from ongoing uti issues. Back pain  has resolved.  Then pt mentioned chest pain that woke her up at 4am and continued for 1 hour. Pt has had several other occurences today, that have lasted a few minutes. Pt took antacid, thinking this was heartburn,but symptoms have not resolved. Pt has had heart concerns in the past. Pt will go to ED for evaluation.    Reason for Disposition  [1] Chest pain (or angina) comes and goes AND [2] is happening more often (increasing in frequency) or getting worse (increasing in severity)  (Exception: Chest pains that last only a few seconds.)  Answer Assessment - Initial Assessment Questions 1. APPEARANCE of RASH: Describe the rash.      Stomach and back - itchy - tiny open 2. LOCATION: Where is the rash located?      Stomach and back 3. NUMBER: How many spots are there?      A lot 4. SIZE: How big are the spots? (Inches, centimeters or compare to size of a coin)      Very small 5. ONSET: When did the rash start?      Tuesday 6. ITCHING: Does the rash itch? If Yes, ask: How bad is the itch?  (Scale 0-10; or none, mild, moderate, severe)     yes 7. PAIN: Does the rash hurt? If Yes, ask: How bad is the pain?  (Scale 0-10; or none, mild, moderate, severe)    -  NONE (0): no pain    - MILD (1-3): doesn't interfere with normal activities     - MODERATE (4-7): interferes with normal activities or awakens from sleep     - SEVERE (8-10): excruciating pain, unable to do any normal activities     none 8. OTHER SYMPTOMS: Do you have any other symptoms? (e.g., fever)     Chest pain - above left nipple  Answer Assessment - Initial Assessment Questions 1. LOCATION: Where does it hurt?       Left side above nipple 2. RADIATION: Does the pain go anywhere else? (e.g., into neck, jaw, arms, back)     Stays there  3. ONSET: When did the chest pain begin? (Minutes, hours or days)      4 am stayed righ there for 1 hour 4. PATTERN: Does the pain come and go, or has it been constant since it started?  Does it get worse with exertion?      Comes and goes 5. DURATION: How long does it last (e.g., seconds, minutes, hours)      I hour this  morning.  6. SEVERITY: How bad is the pain?  (e.g., Scale 1-10; mild, moderate, or severe)    - MILD (1-3): doesn't interfere with normal activities     - MODERATE (4-7): interferes with normal activities or awakens from sleep    - SEVERE (8-10): excruciating pain, unable to  do any normal activities       Moderate - woke her up 7. CARDIAC RISK FACTORS: Do you have any history of heart problems or risk factors for heart disease? (e.g., angina, prior heart attack; diabetes, high blood pressure, high cholesterol, smoker, or strong family history of heart disease)     yes 8. PULMONARY RISK FACTORS: Do you have any history of lung disease?  (e.g., blood clots in lung, asthma, emphysema, birth control pills)     no 9. CAUSE: What do you think is causing the chest pain?     unsure 10. OTHER SYMPTOMS: Do you have any other symptoms? (e.g., dizziness, nausea, vomiting, sweating, fever, difficulty breathing, cough)  Protocols used: Rash or Redness - Localized-A-AH, Chest Pain-A-AH

## 2023-02-18 ENCOUNTER — Ambulatory Visit: Payer: Self-pay

## 2023-02-18 ENCOUNTER — Ambulatory Visit: Payer: 59 | Admitting: Internal Medicine

## 2023-02-18 ENCOUNTER — Encounter: Payer: Self-pay | Admitting: Internal Medicine

## 2023-02-18 VITALS — BP 120/78 | HR 96 | Temp 98.0°F | Resp 16 | Ht 68.0 in | Wt 157.1 lb

## 2023-02-18 DIAGNOSIS — N3090 Cystitis, unspecified without hematuria: Secondary | ICD-10-CM | POA: Diagnosis not present

## 2023-02-18 DIAGNOSIS — R350 Frequency of micturition: Secondary | ICD-10-CM | POA: Diagnosis not present

## 2023-02-18 DIAGNOSIS — Z1231 Encounter for screening mammogram for malignant neoplasm of breast: Secondary | ICD-10-CM | POA: Diagnosis not present

## 2023-02-18 DIAGNOSIS — R109 Unspecified abdominal pain: Secondary | ICD-10-CM

## 2023-02-18 LAB — POCT URINALYSIS DIPSTICK
Bilirubin, UA: NEGATIVE
Glucose, UA: NEGATIVE
Ketones, UA: NEGATIVE
Nitrite, UA: NEGATIVE
Protein, UA: POSITIVE — AB
Spec Grav, UA: 1.02 (ref 1.010–1.025)
Urobilinogen, UA: 0.2 U/dL
pH, UA: 6 (ref 5.0–8.0)

## 2023-02-18 MED ORDER — NITROFURANTOIN MONOHYD MACRO 100 MG PO CAPS: 100.0000 mg | ORAL_CAPSULE | Freq: Two times a day (BID) | ORAL | 0 refills | Status: AC

## 2023-02-18 NOTE — Progress Notes (Signed)
 Acute Office Visit  Subjective:     Patient ID: Kimberly Cantrell, female    DOB: 1965-03-12, 58 y.o.   MRN: [id number]  Chief Complaint  Patient presents with   Abdominal Pain   Urinary Frequency    HPI Patient is in today for concerns for UTI. Patient was seen for similar symptoms on 02/03/23 - UA at the time showing leukocytes but negative for nitrates and blood. She was started on Bactrim  but this caused a rash and she did not finish it. Her urine culture ended up negative for growth. She has had ongoing RLQ pain since September. Discussed this with her GI doctor - CT A/P 10/24 showing no no acute abnormality in the abdomen or pelvis and without urolithiasis or hydronephrosis, appendix normal. She then had a colonoscopy and EGD which were both negative.   URINARY SYMPTOMS Dysuria: no Urinary frequency: yes Urgency: yes Foul odor: yes Hematuria: no Abdominal pain: yes Back pain: yes Suprapubic pain/pressure: yes Flank pain: yes on the right Fever:  no Vomiting: no     Review of Systems  Constitutional:  Negative for chills and fever.  Gastrointestinal:  Positive for abdominal pain. Negative for nausea and vomiting.  Genitourinary:  Positive for flank pain, frequency and urgency. Negative for dysuria and hematuria.        Objective:    BP 120/78   Pulse 96   Temp 98 F (36.7 C) (Oral)   Resp 16   Ht 5' 8 (1.727 m)   Wt 157 lb 1.6 oz (71.3 kg)   SpO2 98%   BMI 23.89 kg/m  BP Readings from Last 3 Encounters:  02/18/23 120/78  02/03/23 118/76  06/11/22 118/76   Wt Readings from Last 3 Encounters:  02/18/23 157 lb 1.6 oz (71.3 kg)  02/03/23 154 lb (69.9 kg)  06/11/22 149 lb 1.6 oz (67.6 kg)      Physical Exam Constitutional:      Appearance: Normal appearance. She is well-developed.  HENT:     Head: Normocephalic and atraumatic.  Eyes:     Conjunctiva/sclera: Conjunctivae normal.  Cardiovascular:     Rate and Rhythm: Normal rate and regular  rhythm.  Pulmonary:     Effort: Pulmonary effort is normal.     Breath sounds: Normal breath sounds.  Abdominal:     Tenderness: There is right CVA tenderness. There is no left CVA tenderness.  Skin:    General: Skin is warm and dry.  Neurological:     General: No focal deficit present.     Mental Status: She is alert. Mental status is at baseline.  Psychiatric:        Mood and Affect: Mood normal.        Behavior: Behavior normal.     Results for orders placed or performed in visit on 02/18/23  POCT urinalysis dipstick  Result Value Ref Range   Color, UA yellow    Clarity, UA cloudy    Glucose, UA Negative Negative   Bilirubin, UA Negative    Ketones, UA Negative    Spec Grav, UA 1.020 1.010 - 1.025   Blood, UA moderate    pH, UA 6.0 5.0 - 8.0   Protein, UA Positive (A) Negative   Urobilinogen, UA 0.2 0.2 or 1.0 E.U./dL   Nitrite, UA Negative    Leukocytes, UA Moderate (2+) (A) Negative   Appearance yellow    Odor foul         Assessment & Plan:  1. Cystitis (Primary)/Urinary frequency/Abdominal pain, unspecified abdominal location: UA here today still negative for nitrates but with new microscopic blood and leukocytes. Will treat with Macrobid  and send for culture. Abdominal pain ongoing, had work up in October including CT A/P and colonoscopy/EGD that was negative. I think she needs a cystoscopy to look at the bladder and urinary tract due to ongoing pelvic/abdominal pain and negative GI work up. Patient also has had hysterectomy and left ovary removed, still has right ovary but CT scan negative for adexenal changes. Can consider pelvic US  if Urology work up negative.   - nitrofurantoin , macrocrystal-monohydrate, (MACROBID ) 100 MG capsule; Take 1 capsule (100 mg total) by mouth 2 (two) times daily for 5 days.  Dispense: 10 capsule; Refill: 0 - POCT urinalysis dipstick - Urine Culture  2. Encounter for screening mammogram for malignant neoplasm of breast: Mammogram  ordered.   - MM 3D SCREENING MAMMOGRAM BILATERAL BREAST; Future  Return in about 4 months (around 06/18/2023), or if symptoms worsen or fail to improve.  Sharyle Fischer, DO

## 2023-02-18 NOTE — Telephone Encounter (Signed)
 Chief Complaint: Abdominal pain RUQ to back Symptoms: back pain, urine frequency, urine odor, pain 6-7/10 Frequency: ongoing since last OV 02/03/23, got worse this week Tues/Wed Pertinent Negatives: Patient denies other symptoms Disposition: [] ED /[] Urgent Care (no appt availability in office) / [x] Appointment(In office/virtual)/ []  [facility]/ [] Home Care/ [] Refused Recommended Disposition /[]  [facility]/ []  Follow-up with PCP Additional Notes: Patient says she called yesterday and was advised ED for CP, but went to cardiologist instead for the CP. She says she was having the same symptoms yesterday the RLQ abdominal pain and thinks it's the UTI that hasn't cleared up and will need antibiotics. She says she was given bactrim  on 02/03/23 and broke out in a rash after 2 doses, so she stopped taking it and didn't call until yesterday. Advised OV with a different provider today due to none with PCP, she agrees, scheduled with Dr. Bernardo.   Reason for Disposition  [1] MODERATE pain (e.g., interferes with normal activities) AND [2] pain comes and goes (cramps) AND [3] present > 24 hours  (Exception: Pain with Vomiting or Diarrhea - see that Guideline.)  Answer Assessment - Initial Assessment Questions 1. LOCATION: Where does it hurt?      RLQ  2. RADIATION: Does the pain shoot anywhere else? (e.g., chest, back)     Around through to back 3. ONSET: When did the pain begin? (e.g., minutes, hours or days ago)      Off and on since last OV 02/03/23, got worse since Tues/Wed this week 4. SUDDEN: Gradual or sudden onset?     Gradual 5. PATTERN Does the pain come and go, or is it constant?    - If it comes and goes: How long does it last? Do you have pain now?     (Note: Comes and goes means the pain is intermittent. It goes away completely between bouts.)    - If constant: Is it getting better, staying the same, or getting worse?      (Note: Constant means the  pain never goes away completely; most serious pain is constant and gets worse.)      Constant  6. SEVERITY: How bad is the pain?  (e.g., Scale 1-10; mild, moderate, or severe)    - MILD (1-3): Doesn't interfere with normal activities, abdomen soft and not tender to touch.     - MODERATE (4-7): Interferes with normal activities or awakens from sleep, abdomen tender to touch.     - SEVERE (8-10): Excruciating pain, doubled over, unable to do any normal activities.       6-7 constant, stabbing pain worse 7. RECURRENT SYMPTOM: Have you ever had this type of stomach pain before? If Yes, ask: When was the last time? and What happened that time?      Yes, seen last month UTI, stopped abx due to rash 8. CAUSE: What do you think is causing the stomach pain?     UTI 9. OTHER SYMPTOMS: Do you have any other symptoms? (e.g., back pain, diarrhea, fever, urination pain, vomiting)     Back pain, frequency, little odor  Protocols used: Abdominal Pain - Female-A-AH

## 2023-02-19 LAB — URINE CULTURE
MICRO NUMBER:: 15916391
SPECIMEN QUALITY:: ADEQUATE

## 2023-02-22 ENCOUNTER — Other Ambulatory Visit: Payer: Self-pay | Admitting: Internal Medicine

## 2023-02-22 DIAGNOSIS — R079 Chest pain, unspecified: Secondary | ICD-10-CM

## 2023-02-25 ENCOUNTER — Ambulatory Visit
Admission: RE | Admit: 2023-02-25 | Discharge: 2023-02-25 | Disposition: A | Discharge status: Home / Self Care | Payer: Self-pay | Source: Ambulatory Visit | Attending: Internal Medicine | Admitting: Internal Medicine

## 2023-02-25 DIAGNOSIS — R079 Chest pain, unspecified: Secondary | ICD-10-CM | POA: Insufficient documentation

## 2023-06-01 ENCOUNTER — Ambulatory Visit: Admitting: Family Medicine

## 2023-06-17 ENCOUNTER — Encounter: Payer: 59 | Admitting: Family Medicine

## 2023-06-20 ENCOUNTER — Other Ambulatory Visit: Payer: Self-pay | Admitting: Anesthesiology

## 2023-06-20 DIAGNOSIS — M542 Cervicalgia: Secondary | ICD-10-CM

## 2023-06-20 DIAGNOSIS — G8929 Other chronic pain: Secondary | ICD-10-CM

## 2023-06-20 DIAGNOSIS — M0600 Rheumatoid arthritis without rheumatoid factor, unspecified site: Secondary | ICD-10-CM

## 2023-06-20 DIAGNOSIS — G894 Chronic pain syndrome: Secondary | ICD-10-CM
